# Patient Record
Sex: Female | Born: 1975 | Race: Black or African American | Hispanic: No | Marital: Single | Smoking: Never smoker
Health system: Southern US, Community
[De-identification: ages and names within clinical notes are randomized; demographics above are authoritative.]

## PROBLEM LIST (undated history)

## (undated) DIAGNOSIS — Z789 Other specified health status: Secondary | ICD-10-CM

## (undated) DIAGNOSIS — J302 Other seasonal allergic rhinitis: Secondary | ICD-10-CM

## (undated) HISTORY — PX: NO PAST SURGERIES: SHX2092

## (undated) HISTORY — PX: TOOTH EXTRACTION: SUR596

---

## 2002-06-18 ENCOUNTER — Encounter: Payer: Self-pay | Admitting: Obstetrics and Gynecology

## 2002-06-18 ENCOUNTER — Ambulatory Visit (HOSPITAL_COMMUNITY): Admission: RE | Admit: 2002-06-18 | Discharge: 2002-06-18 | Payer: Self-pay | Admitting: Obstetrics and Gynecology

## 2002-07-16 ENCOUNTER — Other Ambulatory Visit: Admission: RE | Admit: 2002-07-16 | Discharge: 2002-07-16 | Payer: Self-pay | Admitting: Obstetrics and Gynecology

## 2002-07-28 ENCOUNTER — Encounter: Admission: RE | Admit: 2002-07-28 | Discharge: 2002-10-26 | Payer: Self-pay | Admitting: Obstetrics and Gynecology

## 2002-11-20 ENCOUNTER — Inpatient Hospital Stay (HOSPITAL_COMMUNITY): Admission: AD | Admit: 2002-11-20 | Discharge: 2002-11-22 | Payer: Self-pay

## 2004-07-22 ENCOUNTER — Other Ambulatory Visit: Admission: RE | Admit: 2004-07-22 | Discharge: 2004-07-22 | Payer: Self-pay | Admitting: Obstetrics and Gynecology

## 2005-10-06 ENCOUNTER — Other Ambulatory Visit: Admission: RE | Admit: 2005-10-06 | Discharge: 2005-10-06 | Payer: Self-pay | Admitting: Obstetrics & Gynecology

## 2007-08-27 ENCOUNTER — Other Ambulatory Visit: Admission: RE | Admit: 2007-08-27 | Discharge: 2007-08-27 | Payer: Self-pay | Admitting: Obstetrics and Gynecology

## 2008-01-07 ENCOUNTER — Other Ambulatory Visit: Admission: RE | Admit: 2008-01-07 | Discharge: 2008-01-07 | Payer: Self-pay | Admitting: Obstetrics and Gynecology

## 2008-09-07 ENCOUNTER — Other Ambulatory Visit: Admission: RE | Admit: 2008-09-07 | Discharge: 2008-09-07 | Payer: Self-pay | Admitting: Obstetrics and Gynecology

## 2010-10-10 ENCOUNTER — Other Ambulatory Visit (HOSPITAL_COMMUNITY)
Admission: RE | Admit: 2010-10-10 | Discharge: 2010-10-10 | Disposition: A | Discharge status: Home / Self Care | Payer: Managed Care, Other (non HMO) | Source: Ambulatory Visit | Attending: Family Medicine | Admitting: Family Medicine

## 2010-10-10 DIAGNOSIS — Z0000 Encounter for general adult medical examination without abnormal findings: Secondary | ICD-10-CM | POA: Insufficient documentation

## 2010-10-10 DIAGNOSIS — R87810 Cervical high risk human papillomavirus (HPV) DNA test positive: Secondary | ICD-10-CM | POA: Insufficient documentation

## 2011-01-06 NOTE — H&P (Signed)
NAMEDEHLIA, KILNER                      ACCOUNT NO.:  [id number]   MEDICAL RECORD NO.:  [id number]                   PATIENT TYPE:  INP   LOCATION:  9168                                 FACILITY:  [facility]   PHYSICIAN:  Janine Limbo, M.D.            DATE OF BIRTH:  1975/12/30   DATE OF ADMISSION:  11/20/2002  DATE OF DISCHARGE:                                HISTORY & PHYSICAL   Ms. Rosello is a 35 year old, single black female primigravida at 39-6/7  weeks who presents with leaking fluid since 10:30 p.m. with uterine  contractions picking up since.  She denies headache, nausea, vomiting,  visual disturbances.  Her pregnancy has been followed by the [facility] certified nurse midwife service and has been remarkable for:  1. History of abnormal Pap.  2. Rubella nonimmune.  3. Group B strep positive.    LABORATORY DATA:  Her OB labs were collected on 04/23/02: hemoglobin was 12.4,  hematocrit 35.1, platelets 357,000.  Blood type B positive, antibody  negative.  Sickle cell trait negative.  RPR nonreactive.  Rubella negative.  Hepatitis B surface antigen negative.  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  Maternal serum alpha fetoprotein on 05/21/02 was within normal  range.  One hour Glucola in 12/03 was 104 with hemoglobin at that time 10.7.  Culture of the vaginal tract for group B strep on 10/27/02 was positive, GC  and chlamydia from that time were negative.   HISTORY OF PRESENT PREGNANCY:  She presented for care at [facility] on 04/23/02 at  two weeks gestation.  Pelvic ultrasonography at 17.[redacted] weeks gestation showed  growth consistent with last menstrual period dating.  The rest of her  prenatal care was unremarkable.  She is due for a colposcopy postpartum.   OB HISTORY:  She is a primigravida.   PAST MEDICAL HISTORY:  She has no medication allergies.  She has used Ortho-  Tri-Cyclen in  the past, stopped approximately one year ago.  She had an  abnormal Pap smear in 5/03, ASCUS,and in 7/03 CIN1 with a colposcopy.  She  was treated for Trichomonas one year ago, and for chlamydia she was treated  in '02.   SURGICAL HISTORY:  None.   FAMILY MEDICAL HISTORY:  Paternal uncle with myocardial infarction, mother  with hypertension.  Maternal uncle with history of aneurysm.  Mother with  history of anemia and diabetes.  Paternal grandmother with diabetes.  Paternal grandmother receiving dialysis.  Mother with rheumatoid arthritis.  Maternal great-aunts with breast cancer.   GENETIC HISTORY:  Unremarkable.   SOCIAL HISTORY:  Father of the baby's name is Trey Paula, he is involved and  supportive.  They are of the [place] faith. They both have some college  education.  The patient works full time at [employer] of [place].  Father of the  baby works full time at [employer].  They deny any alcohol, tobacco or  illicit drug use with the pregnancy.   PHYSICAL EXAMINATION:  VITAL SIGNS: Stable, she is afebrile.  HEENT: Grossly within normal limits.  CHEST: Clear to auscultation.  HEART: Regular rate and rhythm.  ABDOMEN: Gravid in contour, fundal height extends approximately 40 cm above  the pubic symphysis.  Fetal heart rate is reactive and reassuring.  Uterine  contractions every four minutes.  STERILE SPECULUM EXAM: Copious clear fluid is present, positive Nitrazine,  positive for ferning.  Cervix is three cm, 80%, vertex, -2.  EXTREMITIES: Within normal limits.   ASSESSMENT:  1. Intrauterine pregnancy at term.  2. Spontaneous rupture of membranes for clear fluid, early labor.   PLAN:  1. Admit for birth, consult Dr. Stefano Gaul.  2. Routine CMN orders.  3. Penicillin G for group B strep prophylaxis.  4. Patient plans epidural for labor.     Cam Hai, C.N.M.                     Janine Limbo, M.D.    KS/MEDQ  D:  11/20/2002  T:  11/20/2002  Job:  [id number]

## 2011-09-12 ENCOUNTER — Ambulatory Visit (INDEPENDENT_AMBULATORY_CARE_PROVIDER_SITE_OTHER): Payer: Managed Care, Other (non HMO)

## 2011-09-12 DIAGNOSIS — J019 Acute sinusitis, unspecified: Secondary | ICD-10-CM

## 2011-09-12 DIAGNOSIS — R05 Cough: Secondary | ICD-10-CM

## 2011-10-06 ENCOUNTER — Other Ambulatory Visit: Payer: Self-pay | Admitting: Obstetrics and Gynecology

## 2011-10-06 ENCOUNTER — Other Ambulatory Visit (HOSPITAL_COMMUNITY)
Admission: RE | Admit: 2011-10-06 | Discharge: 2011-10-06 | Disposition: A | Discharge status: Home / Self Care | Payer: Managed Care, Other (non HMO) | Source: Ambulatory Visit | Attending: Obstetrics and Gynecology | Admitting: Obstetrics and Gynecology

## 2011-10-06 DIAGNOSIS — Z01419 Encounter for gynecological examination (general) (routine) without abnormal findings: Secondary | ICD-10-CM | POA: Insufficient documentation

## 2012-03-15 ENCOUNTER — Ambulatory Visit (INDEPENDENT_AMBULATORY_CARE_PROVIDER_SITE_OTHER): Payer: Managed Care, Other (non HMO) | Admitting: Emergency Medicine

## 2012-03-15 ENCOUNTER — Ambulatory Visit: Payer: Managed Care, Other (non HMO)

## 2012-03-15 VITALS — BP 116/76 | HR 76 | Temp 98.5°F | Resp 16 | Ht 69.0 in | Wt 210.4 lb

## 2012-03-15 DIAGNOSIS — M5430 Sciatica, unspecified side: Secondary | ICD-10-CM

## 2012-03-15 MED ORDER — TRAMADOL HCL 50 MG PO TABS: 50.0000 mg | ORAL_TABLET | Freq: Four times a day (QID) | ORAL | Status: AC | PRN

## 2012-03-15 MED ORDER — NAPROXEN SODIUM 550 MG PO TABS: 550.0000 mg | ORAL_TABLET | Freq: Two times a day (BID) | ORAL | Status: DC

## 2012-03-15 MED ORDER — CYCLOBENZAPRINE HCL 10 MG PO TABS: 10.0000 mg | ORAL_TABLET | Freq: Three times a day (TID) | ORAL | Status: AC | PRN

## 2012-03-15 NOTE — Progress Notes (Signed)
   Date:  03/15/2012   Name:  Kimberly Clayton   DOB:  Dec 30, 1975   MRN:  [id number]  PCP:  No primary provider on file.    Chief Complaint: Back Pain and Leg Pain   History of Present Illness:  Kimberly Clayton is a 36 y.o. very pleasant female patient who presents with the following:  History months ago of pain in right leg originating in thigh and radiating down her leg to her anterior calf.  Says the pain is sharp in nature and associated with numbness in right foot at times.  Worse when she sits for prolonged periods at work while working at [employer].  No history of injury.  Had negative plain films at [facility] office previously but never had MR despite her FMD  Thinking that she may have a neuritis.  Now has recurrence of pain that has been present for several weeks and not responding to motrin and interfering with sleep.  There is no problem list on file for this patient.   No past medical history on file.  No past surgical history on file.  History  Substance Use Topics  . Smoking status: Never Smoker   . Smokeless tobacco: Not on file  . Alcohol Use: Not on file    No family history on file.  No Known Allergies  Medication list has been reviewed and updated.  No current outpatient prescriptions on file prior to visit.    Review of Systems:  As per HPI, otherwise negative.    Physical Examination: Filed Vitals:   03/15/12 1026  BP: 116/76  Pulse: 76  Temp: 98.5 F (36.9 C)  Resp: 16   Filed Vitals:   03/15/12 1026  Height: 5\' 9"  (1.753 m)  Weight: 210 lb 6.4 oz (95.437 kg)   Body mass index is 31.07 kg/(m^2). Ideal Body Weight: Weight in (lb) to have BMI = 25: 168.9    GEN: WDWN, NAD, Non-toxic, Alert & Oriented x 3 HEENT: Atraumatic, Normocephalic.  Ears and Nose: No external deformity. EXTR: No clubbing/cyanosis/edema NEURO: Normal gait. Decreased DTR right leg.  Strength normal.  Sensation normal. PSYCH: Normally interactive. Conversant.  Not depressed or anxious appearing.  Calm demeanor.  Back:not tender  Assessment and Plan: Sciatic neuritis Ultram Flexeril Anaprox Heat Follow up in two weeks if not improved  Carmelina Dane, MD

## 2012-07-11 ENCOUNTER — Ambulatory Visit: Payer: Managed Care, Other (non HMO)

## 2012-07-13 ENCOUNTER — Ambulatory Visit: Payer: Managed Care, Other (non HMO)

## 2012-07-13 ENCOUNTER — Ambulatory Visit (INDEPENDENT_AMBULATORY_CARE_PROVIDER_SITE_OTHER): Payer: Managed Care, Other (non HMO) | Admitting: Family Medicine

## 2012-07-13 VITALS — BP 109/81 | HR 88 | Temp 98.1°F | Resp 18 | Ht 69.0 in | Wt 222.0 lb

## 2012-07-13 DIAGNOSIS — M79673 Pain in unspecified foot: Secondary | ICD-10-CM

## 2012-07-13 DIAGNOSIS — M79609 Pain in unspecified limb: Secondary | ICD-10-CM

## 2012-07-13 MED ORDER — IBUPROFEN 800 MG PO TABS: 800.0000 mg | ORAL_TABLET | Freq: Three times a day (TID) | ORAL | Status: DC | PRN

## 2012-07-13 NOTE — Progress Notes (Signed)
[facility]:  Office Visit  Chief Complaint:  Chief Complaint  Patient presents with  . Foot Pain    heel pain, right foot    HPI: Kimberly Clayton is a 36 y.o. female who complains of   Right heel pain x 1 month. Elevate, ice, ibuprofen otc with minimal relief. Gets up in the AM and gets worse as day progesses and being on feet. No h/o PF, injuries. Denies numbness/tingling. No new foot wear. She walks with socks or barefoot at home. Does not have any tendon pain. Does not have any calf pain or toe pain.    History reviewed. No pertinent past medical history. History reviewed. No pertinent past surgical history. History   Social History  . Marital Status: Single    Spouse Name: N/A    Number of Children: N/A  . Years of Education: N/A   Social History Main Topics  . Smoking status: Never Smoker   . Smokeless tobacco: None  . Alcohol Use: No  . Drug Use: No  . Sexually Active: None   Other Topics Concern  . None   Social History Narrative  . None   Family History  Problem Relation Age of Onset  . Diabetes Mother   . Hypertension Mother   . Arthritis Mother    No Known Allergies Prior to Admission medications   Medication Sig Start Date End Date Taking? Authorizing Provider  ibuprofen (ADVIL,MOTRIN) 200 MG tablet Take 400 mg by mouth every 6 (six) hours as needed.    Historical Provider, MD  naproxen sodium (ANAPROX DS) 550 MG tablet Take 1 tablet (550 mg total) by mouth 2 (two) times daily with a meal. 03/15/12 03/15/13  Phillips Odor, MD     ROS: The patient denies fevers, chills, night sweats, unintentional weight loss, chest pain, palpitations, wheezing, dyspnea on exertion, nausea, vomiting, abdominal pain, dysuria, hematuria, melena, numbness, weakness, or tingling.  All other systems have been reviewed and were otherwise negative with the exception of those mentioned in the HPI and as above.    PHYSICAL EXAM: Filed Vitals:   07/13/12 1740  BP: 109/81  Pulse: 88  Temp: 98.1 F (36.7 C)  Resp: 18   Filed Vitals:   07/13/12 1740  Height: 5\' 9"  (1.753 m)  Weight: 222 lb (100.699 kg)   Body mass index is 32.78 kg/(m^2).  General: Alert, no acute distress HEENT:  Normocephalic, atraumatic, oropharynx patent.  Cardiovascular:  Regular rate and rhythm, no rubs murmurs or gallops.  No Carotid bruits, radial pulse intact. No pedal edema.  Respiratory: Clear to auscultation bilaterally.  No wheezes, rales, or rhonchi.  No cyanosis, no use of accessory musculature GI: No organomegaly, abdomen is soft and non-tender, positive bowel sounds.  No masses. Skin: No rashes. Neurologic: Facial musculature symmetric. Psychiatric: Patient is appropriate throughout our interaction. Lymphatic: No cervical lymphadenopathy Musculoskeletal: Gait intact. Tender at right heel, ROM and sensation intact, 5/5 strength, neg anterior drawer test, no pain with plantar or dorsiflexion, no swellingm no redness, no warmth   LABS: No results found for this or any previous visit.   EKG/XRAY:   Primary read interpreted by Dr. Conley Rolls at [facility]. Heel spur. No fx/dislocation   ASSESSMENT/PLAN: Encounter Diagnosis  Name Primary?  . Heel pain Yes   Heel spur on xray, but her sxs are very c/w Plantar fasciitis. She wakes up with heel pain and worse as day progresses.  Advise to Wear gel pads and  Never  to go barefoot. Do ROM and ice with bottle water after a day of being on feet Rx Ibuprofen F/u prn in 1 month    Kimberly Weingarten PHUONG, DO 07/13/2012 7:40 PM

## 2012-08-07 ENCOUNTER — Other Ambulatory Visit: Payer: Self-pay | Admitting: Family Medicine

## 2012-09-06 ENCOUNTER — Other Ambulatory Visit: Payer: Self-pay | Admitting: Physician Assistant

## 2012-12-13 ENCOUNTER — Other Ambulatory Visit (HOSPITAL_COMMUNITY)
Admission: RE | Admit: 2012-12-13 | Discharge: 2012-12-13 | Disposition: A | Discharge status: Home / Self Care | Payer: Managed Care, Other (non HMO) | Source: Ambulatory Visit | Attending: Obstetrics and Gynecology | Admitting: Obstetrics and Gynecology

## 2012-12-13 ENCOUNTER — Other Ambulatory Visit: Payer: Self-pay | Admitting: Obstetrics and Gynecology

## 2012-12-13 DIAGNOSIS — Z01419 Encounter for gynecological examination (general) (routine) without abnormal findings: Secondary | ICD-10-CM | POA: Insufficient documentation

## 2012-12-13 DIAGNOSIS — Z1151 Encounter for screening for human papillomavirus (HPV): Secondary | ICD-10-CM | POA: Insufficient documentation

## 2012-12-26 ENCOUNTER — Encounter (HOSPITAL_COMMUNITY): Payer: Self-pay | Admitting: Pharmacy Technician

## 2013-01-03 ENCOUNTER — Encounter (HOSPITAL_COMMUNITY): Payer: Self-pay

## 2013-01-03 ENCOUNTER — Encounter (HOSPITAL_COMMUNITY)
Admission: RE | Admit: 2013-01-03 | Discharge: 2013-01-03 | Disposition: A | Discharge status: Home / Self Care | Payer: Managed Care, Other (non HMO) | Source: Ambulatory Visit | Attending: Obstetrics and Gynecology | Admitting: Obstetrics and Gynecology

## 2013-01-03 HISTORY — DX: Other specified health status: Z78.9

## 2013-01-03 HISTORY — DX: Other seasonal allergic rhinitis: J30.2

## 2013-01-03 LAB — CBC
HCT: 33 % — ABNORMAL LOW (ref 36.0–46.0)
Hemoglobin: 10.9 g/dL — ABNORMAL LOW (ref 12.0–15.0)
MCV: 97.9 fL (ref 78.0–100.0)
RDW: 11 % — ABNORMAL LOW (ref 11.5–15.5)
WBC: 8.8 10*3/uL (ref 4.0–10.5)

## 2013-01-03 LAB — SURGICAL PCR SCREEN: Staphylococcus aureus: NEGATIVE

## 2013-01-03 NOTE — Patient Instructions (Addendum)
   Your procedure is scheduled on:  Wednesday, May 21  Enter through the [employer] [facility] at: 1030 am Pick up the phone at the desk and dial [phone] and inform us of your arrival.  Please call this number if you have any problems the morning of surgery: [phone]  Remember: Do not eat or drink after midnight: Tuesday Take these medicines the morning of surgery with a SIP OF WATER: zyrtec with small sip of water.  Do not wear jewelry, make-up, or FINGER nail polish No metal in your hair or on your body. Do not wear lotions, powders, perfumes. You may wear deodorant.  Please use your CHG wash as directed prior to surgery.  Do not shave anywhere for at least 12 hours prior to first CHG shower.  Do not bring valuables to the hospital. Contacts, dentures or bridgework may not be worn into surgery.  Patients discharged on the day of surgery will not be allowed to drive home.  Home with mother Demetri Kerman.

## 2013-01-07 ENCOUNTER — Other Ambulatory Visit: Payer: Self-pay | Admitting: Obstetrics and Gynecology

## 2013-01-08 ENCOUNTER — Encounter (HOSPITAL_COMMUNITY): Payer: Self-pay | Admitting: Anesthesiology

## 2013-01-08 ENCOUNTER — Encounter (HOSPITAL_COMMUNITY)
Admission: RE | Disposition: A | Discharge status: Home / Self Care | Payer: Self-pay | Source: Ambulatory Visit | Attending: Obstetrics and Gynecology

## 2013-01-08 ENCOUNTER — Ambulatory Visit (HOSPITAL_COMMUNITY): Payer: Managed Care, Other (non HMO) | Admitting: Anesthesiology

## 2013-01-08 ENCOUNTER — Encounter (HOSPITAL_COMMUNITY): Payer: Self-pay | Admitting: Certified Registered"

## 2013-01-08 ENCOUNTER — Ambulatory Visit (HOSPITAL_COMMUNITY)
Admission: RE | Admit: 2013-01-08 | Discharge: 2013-01-08 | Disposition: A | Discharge status: Home / Self Care | Payer: Managed Care, Other (non HMO) | Source: Ambulatory Visit | Attending: Obstetrics and Gynecology | Admitting: Obstetrics and Gynecology

## 2013-01-08 DIAGNOSIS — Z302 Encounter for sterilization: Secondary | ICD-10-CM | POA: Insufficient documentation

## 2013-01-08 HISTORY — PX: LAPAROSCOPIC TUBAL LIGATION: SHX1937

## 2013-01-08 LAB — PREGNANCY, URINE: Preg Test, Ur: NEGATIVE

## 2013-01-08 SURGERY — LIGATION, FALLOPIAN TUBE, LAPAROSCOPIC
Anesthesia: Epidural | Laterality: Bilateral | Wound class: Clean Contaminated

## 2013-01-08 MED ORDER — MIDAZOLAM HCL 2 MG/2ML IJ SOLN
INTRAMUSCULAR | Status: AC
  Filled 2013-01-08: qty 2

## 2013-01-08 MED ORDER — ONDANSETRON HCL 4 MG/2ML IJ SOLN
INTRAMUSCULAR | Status: DC | PRN
  Administered 2013-01-08: 4 mg via INTRAVENOUS

## 2013-01-08 MED ORDER — FENTANYL CITRATE 0.05 MG/ML IJ SOLN
INTRAMUSCULAR | Status: AC
  Filled 2013-01-08: qty 5

## 2013-01-08 MED ORDER — CEFAZOLIN SODIUM-DEXTROSE 2-3 GM-% IV SOLR
2.0000 g | INTRAVENOUS | Status: AC
  Administered 2013-01-08: 2 g via INTRAVENOUS

## 2013-01-08 MED ORDER — NEOSTIGMINE METHYLSULFATE 1 MG/ML IJ SOLN
INTRAMUSCULAR | Status: DC | PRN
  Administered 2013-01-08: 3 mg via INTRAVENOUS

## 2013-01-08 MED ORDER — PHENYLEPHRINE 40 MCG/ML (10ML) SYRINGE FOR IV PUSH (FOR BLOOD PRESSURE SUPPORT)
PREFILLED_SYRINGE | INTRAVENOUS | Status: AC
  Filled 2013-01-08: qty 5

## 2013-01-08 MED ORDER — KETOROLAC TROMETHAMINE 30 MG/ML IJ SOLN: 30.0000 mg | Freq: Once | INTRAMUSCULAR | Status: AC

## 2013-01-08 MED ORDER — IBUPROFEN 800 MG PO TABS: 800.0000 mg | ORAL_TABLET | Freq: Three times a day (TID) | ORAL | Status: DC | PRN

## 2013-01-08 MED ORDER — GLYCOPYRROLATE 0.2 MG/ML IJ SOLN
INTRAMUSCULAR | Status: DC | PRN
  Administered 2013-01-08: .5 mg via INTRAVENOUS

## 2013-01-08 MED ORDER — ROCURONIUM BROMIDE 100 MG/10ML IV SOLN
INTRAVENOUS | Status: DC | PRN
  Administered 2013-01-08: 35 mg via INTRAVENOUS
  Administered 2013-01-08: 15 mg via INTRAVENOUS

## 2013-01-08 MED ORDER — BUPIVACAINE HCL (PF) 0.25 % IJ SOLN
INTRAMUSCULAR | Status: AC
  Filled 2013-01-08: qty 30

## 2013-01-08 MED ORDER — MIDAZOLAM HCL 5 MG/5ML IJ SOLN
INTRAMUSCULAR | Status: DC | PRN
  Administered 2013-01-08: 2 mg via INTRAVENOUS

## 2013-01-08 MED ORDER — DEXAMETHASONE SODIUM PHOSPHATE 4 MG/ML IJ SOLN
INTRAMUSCULAR | Status: DC | PRN
  Administered 2013-01-08: 10 mg via INTRAVENOUS

## 2013-01-08 MED ORDER — LACTATED RINGERS IV SOLN
INTRAVENOUS | Status: DC
  Administered 2013-01-08 (×3): via INTRAVENOUS

## 2013-01-08 MED ORDER — BUPIVACAINE HCL (PF) 0.25 % IJ SOLN
INTRAMUSCULAR | Status: DC | PRN
  Administered 2013-01-08: 30 mL

## 2013-01-08 MED ORDER — PROPOFOL 10 MG/ML IV BOLUS
INTRAVENOUS | Status: DC | PRN
  Administered 2013-01-08: 180 mg via INTRAVENOUS

## 2013-01-08 MED ORDER — FENTANYL CITRATE 0.05 MG/ML IJ SOLN
INTRAMUSCULAR | Status: DC | PRN
  Administered 2013-01-08 (×5): 50 ug via INTRAVENOUS

## 2013-01-08 MED ORDER — PHENYLEPHRINE HCL 10 MG/ML IJ SOLN
INTRAMUSCULAR | Status: DC | PRN
  Administered 2013-01-08 (×2): 80 mg via INTRAVENOUS

## 2013-01-08 MED ORDER — PROPOFOL 10 MG/ML IV EMUL
INTRAVENOUS | Status: AC
  Filled 2013-01-08: qty 20

## 2013-01-08 MED ORDER — KETOROLAC TROMETHAMINE 30 MG/ML IJ SOLN
INTRAMUSCULAR | Status: AC
  Administered 2013-01-08: 30 mg via INTRAVENOUS
  Filled 2013-01-08: qty 1

## 2013-01-08 MED ORDER — FENTANYL CITRATE 0.05 MG/ML IJ SOLN: 25.0000 ug | INTRAMUSCULAR | Status: DC | PRN

## 2013-01-08 MED ORDER — HYDROCODONE-ACETAMINOPHEN 5-325 MG PO TABS: ORAL_TABLET | ORAL | Status: DC

## 2013-01-08 MED ORDER — LIDOCAINE HCL (CARDIAC) 20 MG/ML IV SOLN
INTRAVENOUS | Status: AC
  Filled 2013-01-08: qty 5

## 2013-01-08 MED ORDER — DEXAMETHASONE SODIUM PHOSPHATE 10 MG/ML IJ SOLN
INTRAMUSCULAR | Status: AC
  Filled 2013-01-08: qty 1

## 2013-01-08 MED ORDER — ONDANSETRON HCL 4 MG/2ML IJ SOLN
INTRAMUSCULAR | Status: AC
  Filled 2013-01-08: qty 2

## 2013-01-08 MED ORDER — LIDOCAINE HCL (CARDIAC) 20 MG/ML IV SOLN
INTRAVENOUS | Status: DC | PRN
  Administered 2013-01-08: 100 mg via INTRAVENOUS

## 2013-01-08 MED ORDER — CEFAZOLIN SODIUM-DEXTROSE 2-3 GM-% IV SOLR
INTRAVENOUS | Status: AC
  Filled 2013-01-08: qty 50

## 2013-01-08 SURGICAL SUPPLY — 20 items
APPLICATOR COTTON TIP 6IN STRL (MISCELLANEOUS) ×2 IMPLANT
CATH ROBINSON RED A/P 16FR (CATHETERS) ×2 IMPLANT
CLOTH BEACON ORANGE TIMEOUT ST (SAFETY) ×2 IMPLANT
DERMABOND ADVANCED (GAUZE/BANDAGES/DRESSINGS) ×1
DERMABOND ADVANCED .7 DNX12 (GAUZE/BANDAGES/DRESSINGS) ×1 IMPLANT
DILATOR CANAL MILEX (MISCELLANEOUS) IMPLANT
FORCEPS CUTTING 45CM 5MM (CUTTING FORCEPS) ×2 IMPLANT
GLOVE BIOGEL M 6.5 STRL (GLOVE) ×4 IMPLANT
GLOVE BIOGEL PI IND STRL 6.5 (GLOVE) ×1 IMPLANT
GLOVE BIOGEL PI INDICATOR 6.5 (GLOVE) ×1
GOWN PREVENTION PLUS LG XLONG (DISPOSABLE) ×2 IMPLANT
GOWN PREVENTION PLUS XLARGE (GOWN DISPOSABLE) ×2 IMPLANT
PACK LAPAROSCOPY BASIN (CUSTOM PROCEDURE TRAY) ×2 IMPLANT
SOLUTION ELECTROLUBE (MISCELLANEOUS) ×2 IMPLANT
SUT VICRYL 0 UR6 27IN ABS (SUTURE) ×2 IMPLANT
SUT VICRYL 4-0 PS2 18IN ABS (SUTURE) ×2 IMPLANT
TOWEL OR 17X24 6PK STRL BLUE (TOWEL DISPOSABLE) ×4 IMPLANT
TROCAR XCEL NON-BLD 11X100MML (ENDOMECHANICALS) ×2 IMPLANT
WARMER LAPAROSCOPE (MISCELLANEOUS) ×2 IMPLANT
WATER STERILE IRR 1000ML POUR (IV SOLUTION) ×2 IMPLANT

## 2013-01-08 NOTE — Op Note (Signed)
01/08/2013  1:13 PM  PATIENT:  Kimberly Clayton  37 y.o. female  PRE-OPERATIVE DIAGNOSIS:  Desires Sterilization  POST-OPERATIVE DIAGNOSIS:  Desires Sterilization  PROCEDURE:  Procedure(s): LAPAROSCOPIC TUBAL LIGATION (Bilateral)  SURGEON:  Surgeon(s) and Role:    * Bellah Alia J. Richardson Dopp, MD - Primary  PHYSICIAN ASSISTANT: None   ASSISTANTS: none   ANESTHESIA:   general  EBL:  Total I/O In: 1000 [I.V.:1000] Out: 50 [Urine:50]  BLOOD ADMINISTERED:none  DRAINS: none   LOCAL MEDICATIONS USED:  MARCAINE     SPECIMEN:  No Specimen  DISPOSITION OF SPECIMEN:  N/A  COUNTS:  YES  TOURNIQUET:  * No tourniquets in log *  DICTATION: .Dragon Dictation  PLAN OF CARE: Discharge to home after PACU  PATIENT DISPOSITION:  PACU - hemodynamically stable.   Delay start of Pharmacological VTE agent (>24hrs) due to surgical blood loss or risk of bleeding: not applicable  Procedure: The patient was taken to the operating room where she was placed under general anesthesia. She is placed in the dorsolithotomy position. She was prepped and draped in the usual sterile fashion. Speculum was placed into the vaginal vault. In the anterior lip of the cervix grasped with a single-tooth tenaculum. A uterine manipulator was placed without difficulty. The single-tooth tenaculum was  Removed. The speculum was removed.  Attention was turned to the umbilicus which was injected with 10 cc of quarter percent Marcaine. A 11 mm incision was made with the scalpel. A 11 mm trocar was placed under direct visualization. Entry into the peritoneum was visualized. The pneumoperitoneum was achieved with CO2 gas.  Her  fallopian tubes and ovaries appeared normal.  Operative scope was inserted and the right fallopian tube was identified and followed out to the fimbriated end. The a 2-3 cm portion of the right fallopian tube was cauterized with the Gyrus.  This was repeated on the left fallopian tube The Gyrus was removed. A  laparoscopic needle was inserted and 10 cc of quarter percent Marcaine was placed along the ampullary portion of the tubes  bilaterally. Pneumoperitoneum was released. The umbilical trocar was removed under direct visualization. The fascia was closed with 0 Vicryl. The skin was closed with 4-0 Vicryl. The dermabond placed a over the skin incision.  Patient was awakened from anesthesia taken to the recovery room awake and in stable condition.

## 2013-01-08 NOTE — Anesthesia Preprocedure Evaluation (Signed)
Anesthesia Evaluation  Patient identified by MRN, date of birth, ID band Patient awake    Reviewed: Allergy & Precautions, H&P , Patient's Chart, lab work & pertinent test results, reviewed documented beta blocker date and time   Airway Mallampati: II TM Distance: >3 FB Neck ROM: full    Dental no notable dental hx.    Pulmonary  breath sounds clear to auscultation  Pulmonary exam normal       Cardiovascular Rhythm:regular Rate:Normal     Neuro/Psych    GI/Hepatic   Endo/Other    Renal/GU      Musculoskeletal   Abdominal   Peds  Hematology   Anesthesia Other Findings   Reproductive/Obstetrics                           Anesthesia Physical Anesthesia Plan  ASA: II  Anesthesia Plan: Epidural   Post-op Pain Management:    Induction: Intravenous  Airway Management Planned: Oral ETT  Additional Equipment:   Intra-op Plan:   Post-operative Plan:   Informed Consent: I have reviewed the patients History and Physical, chart, labs and discussed the procedure including the risks, benefits and alternatives for the proposed anesthesia with the patient or authorized representative who has indicated his/her understanding and acceptance.   Dental Advisory Given and Dental advisory given  Plan Discussed with: CRNA and Surgeon  Anesthesia Plan Comments: (  Discussed  general anesthesia, including possible nausea, instrumentation of airway, sore throat,pulmonary aspiration, etc. I asked if the were any outstanding questions, or  concerns before we proceeded. )        Anesthesia Quick Evaluation

## 2013-01-08 NOTE — H&P (Signed)
Kimberly Clayton is an 37 y.o. female G1P1 presents for laparoscopic BTL. She desires permanent sterilization   Pertinent Gynecological History: Menses: flow is moderate Bleeding: NA Contraception: desires BTL DES exposure: denies Blood transfusions: none Sexually transmitted diseases: no past history Previous GYN Procedures: LEEP  Last mammogram: NA Date: NA Last pap: normal Date: 12/13/2012 OB History: G1, P1   Menstrual History:      Past Medical History  Diagnosis Date  . SVD (spontaneous vaginal delivery)     x 1  . Seasonal allergies   . Medical history non-contributory     Past Surgical History  Procedure Laterality Date  . Tooth extraction    . No past surgeries      Family History  Problem Relation Age of Onset  . Diabetes Mother   . Hypertension Mother   . Arthritis Mother     Social History:  reports that she has never smoked. She has never used smokeless tobacco. She reports that  drinks alcohol. She reports that she does not use illicit drugs.  Allergies: No Known Allergies  Prescriptions prior to admission  Medication Sig Dispense Refill  . Cyanocobalamin (B-12 PO) Take 2 tablets by mouth daily.      . fluticasone (FLONASE) 50 MCG/ACT nasal spray Place 2 sprays into the nose daily.      . meloxicam (MOBIC) 15 MG tablet Take 15 mg by mouth daily.      . cetirizine (ZYRTEC) 10 MG tablet Take 10 mg by mouth daily.      Marland Kitchen ibuprofen (ADVIL,MOTRIN) 200 MG tablet Take 400 mg by mouth every 6 (six) hours as needed for pain.         Review of Systems  All other systems reviewed and are negative.    Blood pressure 132/76, pulse 106, temperature 98.6 F (37 C), temperature source Oral, resp. rate 18, SpO2 100.00%. Physical Exam  Constitutional: She is oriented to person, place, and time.  HENT:  Head: Normocephalic.  Cardiovascular: Normal rate and regular rhythm.   Respiratory: Effort normal and breath sounds normal.  GI: Soft. There is no  tenderness. There is no rebound and no guarding.  Genitourinary: Vagina normal and uterus normal.  Musculoskeletal: Normal range of motion.  Neurological: She is alert and oriented to person, place, and time.  Skin: Skin is warm and dry.  Psychiatric: She has a normal mood and affect.    No results found for this or any previous visit (from the past 24 hour(s)).  No results found.  Assessment/Plan: 37 y/o desires permanent sterilization. D/W patient r/b/a of laparoscopic tubal ligation including but not limited to infection/ bleeding / damage to bowel bladder tubes with need for further surgery. R/O failure 1 % with 50% r/o ectopic if failure occurs. Pt voiced understanding and desires to proceed   Aron Needles J. 01/08/2013, 10:49 AM

## 2013-01-08 NOTE — Anesthesia Postprocedure Evaluation (Signed)
  Anesthesia Post-op Note  Patient: Kimberly Clayton Jun  Procedure(s) Performed: Procedure(s): LAPAROSCOPIC TUBAL LIGATION (Bilateral)  Patient Location: PACU  Anesthesia Type:General  Level of Consciousness: awake, alert  and oriented  Airway and Oxygen Therapy: Patient Spontanous Breathing  Post-op Pain: none  Post-op Assessment: Post-op Vital signs reviewed, Patient's Cardiovascular Status Stable, Respiratory Function Stable, Patent Airway, No signs of Nausea or vomiting and Pain level controlled  Post-op Vital Signs: Reviewed and stable  Complications: No apparent anesthesia complications

## 2013-01-08 NOTE — Transfer of Care (Signed)
Immediate Anesthesia Transfer of Care Note  Patient: Kimberly Clayton  Procedure(s) Performed: Procedure(s): LAPAROSCOPIC TUBAL LIGATION (Bilateral)  Patient Location: PACU  Anesthesia Type:General  Level of Consciousness: awake, alert  and oriented  Airway & Oxygen Therapy: Patient Spontanous Breathing and Patient connected to nasal cannula oxygen  Post-op Assessment: Report given to PACU RN, Post -op Vital signs reviewed and stable and Patient moving all extremities  Post vital signs: Reviewed and stable  Complications: No apparent anesthesia complications

## 2013-01-08 NOTE — Preoperative (Signed)
Beta Blockers   Reason not to administer Beta Blockers:Not Applicable 

## 2013-01-09 ENCOUNTER — Encounter (HOSPITAL_COMMUNITY): Payer: Self-pay | Admitting: Obstetrics and Gynecology

## 2014-01-09 ENCOUNTER — Other Ambulatory Visit (HOSPITAL_COMMUNITY)
Admission: RE | Admit: 2014-01-09 | Discharge: 2014-01-09 | Disposition: A | Discharge status: Home / Self Care | Payer: Managed Care, Other (non HMO) | Source: Ambulatory Visit | Attending: Obstetrics and Gynecology | Admitting: Obstetrics and Gynecology

## 2014-01-09 ENCOUNTER — Other Ambulatory Visit: Payer: Self-pay | Admitting: Obstetrics and Gynecology

## 2014-01-09 DIAGNOSIS — Z01419 Encounter for gynecological examination (general) (routine) without abnormal findings: Secondary | ICD-10-CM | POA: Insufficient documentation

## 2014-07-28 ENCOUNTER — Ambulatory Visit (INDEPENDENT_AMBULATORY_CARE_PROVIDER_SITE_OTHER): Payer: Managed Care, Other (non HMO) | Admitting: Family Medicine

## 2014-07-28 VITALS — BP 118/80 | HR 90 | Temp 97.8°F | Resp 18 | Ht 69.0 in | Wt 227.4 lb

## 2014-07-28 DIAGNOSIS — F489 Nonpsychotic mental disorder, unspecified: Secondary | ICD-10-CM

## 2014-07-28 DIAGNOSIS — J019 Acute sinusitis, unspecified: Secondary | ICD-10-CM

## 2014-07-28 DIAGNOSIS — M542 Cervicalgia: Secondary | ICD-10-CM

## 2014-07-28 DIAGNOSIS — Z658 Other specified problems related to psychosocial circumstances: Secondary | ICD-10-CM

## 2014-07-28 MED ORDER — METHOCARBAMOL 750 MG PO TABS: 750.0000 mg | ORAL_TABLET | Freq: Four times a day (QID) | ORAL | Status: DC

## 2014-07-28 MED ORDER — AMOXICILLIN 875 MG PO TABS: 875.0000 mg | ORAL_TABLET | Freq: Two times a day (BID) | ORAL | Status: DC

## 2014-07-28 NOTE — Patient Instructions (Signed)
Drink plenty of fluids and get enough rest  Take amoxicillin one twice daily for infection  Continue using the fluticasone nose spray  Take Claritin-D or Allegra-D if needed for congestion  Take the muscle relaxant, methocarbamol, 1 at bedtime  Stretch shoulders in a fashion we discussed  Return if not improving

## 2014-07-28 NOTE — Progress Notes (Signed)
SUBJECTIVE: Patient has been having and upper respiratory infection for more han the past week. Sure it's being congested. She has been having green drainage now. She has some cough.t Thursday little bit sore. Her ears feel stuffy. Her ears feel stuffy. She's not been running a fever. She does not smoke.  She had a job change and has had a lot of tension. She just on the phone after she doesn't like. Her shoulder and neck sclerae tense. She would like some muscle relaxant.  Objective: No acute distress but head is congested. TMs are normal. Nose congested. Throat clear. Neck supple without nodes. Chest clear. Heart regular without murmurs. Neck is tight. Good range of motion of her head.  Assessment: Tension in cervical pain secondary to that URI and sinusitis  Plan: It is time for her to have a round of antibiotics since it is been over a week. Will go ahead and treat symptomatically.  Give her some Robaxin to take at bedtime. Harder stretch exercises for her neck. Return if needed.

## 2014-09-07 ENCOUNTER — Other Ambulatory Visit: Payer: Self-pay | Admitting: Obstetrics and Gynecology

## 2014-09-07 ENCOUNTER — Other Ambulatory Visit (HOSPITAL_COMMUNITY)
Admission: RE | Admit: 2014-09-07 | Discharge: 2014-09-07 | Disposition: A | Discharge status: Home / Self Care | Payer: BLUE CROSS/BLUE SHIELD | Source: Ambulatory Visit | Attending: Obstetrics and Gynecology | Admitting: Obstetrics and Gynecology

## 2014-09-07 DIAGNOSIS — Z01419 Encounter for gynecological examination (general) (routine) without abnormal findings: Secondary | ICD-10-CM | POA: Insufficient documentation

## 2014-09-07 DIAGNOSIS — Z113 Encounter for screening for infections with a predominantly sexual mode of transmission: Secondary | ICD-10-CM | POA: Insufficient documentation

## 2014-09-08 LAB — CYTOLOGY - PAP

## 2015-11-19 ENCOUNTER — Other Ambulatory Visit: Payer: Self-pay | Admitting: Obstetrics and Gynecology

## 2015-11-19 ENCOUNTER — Other Ambulatory Visit (HOSPITAL_COMMUNITY)
Admission: RE | Admit: 2015-11-19 | Discharge: 2015-11-19 | Disposition: A | Discharge status: Home / Self Care | Payer: 59 | Source: Ambulatory Visit | Attending: Obstetrics and Gynecology | Admitting: Obstetrics and Gynecology

## 2015-11-19 DIAGNOSIS — Z01411 Encounter for gynecological examination (general) (routine) with abnormal findings: Secondary | ICD-10-CM | POA: Diagnosis present

## 2015-11-19 DIAGNOSIS — Z113 Encounter for screening for infections with a predominantly sexual mode of transmission: Secondary | ICD-10-CM | POA: Diagnosis present

## 2015-11-19 DIAGNOSIS — Z1151 Encounter for screening for human papillomavirus (HPV): Secondary | ICD-10-CM | POA: Insufficient documentation

## 2015-11-23 LAB — CYTOLOGY - PAP

## 2016-01-27 ENCOUNTER — Other Ambulatory Visit: Payer: Self-pay | Admitting: Family Medicine

## 2016-01-27 DIAGNOSIS — M5441 Lumbago with sciatica, right side: Secondary | ICD-10-CM

## 2016-02-03 ENCOUNTER — Ambulatory Visit
Admission: RE | Admit: 2016-02-03 | Discharge: 2016-02-03 | Disposition: A | Discharge status: Home / Self Care | Payer: 59 | Source: Ambulatory Visit | Attending: Family Medicine | Admitting: Family Medicine

## 2016-02-03 DIAGNOSIS — M5441 Lumbago with sciatica, right side: Secondary | ICD-10-CM

## 2017-02-10 ENCOUNTER — Ambulatory Visit (INDEPENDENT_AMBULATORY_CARE_PROVIDER_SITE_OTHER): Payer: 59

## 2017-02-10 ENCOUNTER — Ambulatory Visit (HOSPITAL_COMMUNITY): Payer: Self-pay

## 2017-02-10 ENCOUNTER — Ambulatory Visit (INDEPENDENT_AMBULATORY_CARE_PROVIDER_SITE_OTHER): Payer: 59 | Admitting: Physician Assistant

## 2017-02-10 ENCOUNTER — Encounter: Payer: Self-pay | Admitting: Physician Assistant

## 2017-02-10 VITALS — BP 107/74 | HR 71 | Temp 98.8°F | Resp 16 | Ht 69.5 in | Wt 237.8 lb

## 2017-02-10 DIAGNOSIS — G479 Sleep disorder, unspecified: Secondary | ICD-10-CM | POA: Insufficient documentation

## 2017-02-10 DIAGNOSIS — M545 Low back pain, unspecified: Secondary | ICD-10-CM

## 2017-02-10 DIAGNOSIS — M542 Cervicalgia: Secondary | ICD-10-CM | POA: Diagnosis not present

## 2017-02-10 DIAGNOSIS — S39012A Strain of muscle, fascia and tendon of lower back, initial encounter: Secondary | ICD-10-CM

## 2017-02-10 DIAGNOSIS — S161XXA Strain of muscle, fascia and tendon at neck level, initial encounter: Secondary | ICD-10-CM

## 2017-02-10 DIAGNOSIS — J302 Other seasonal allergic rhinitis: Secondary | ICD-10-CM | POA: Insufficient documentation

## 2017-02-10 MED ORDER — CYCLOBENZAPRINE HCL 10 MG PO TABS: ORAL_TABLET | ORAL | 0 refills | Status: DC

## 2017-02-10 MED ORDER — IBUPROFEN 800 MG PO TABS: 800.0000 mg | ORAL_TABLET | Freq: Three times a day (TID) | ORAL | 0 refills | Status: DC | PRN

## 2017-02-10 MED ORDER — TRAMADOL HCL 50 MG PO TABS: 50.0000 mg | ORAL_TABLET | Freq: Three times a day (TID) | ORAL | 0 refills | Status: DC | PRN
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
## 2017-02-10 NOTE — Patient Instructions (Signed)
     IF you received an x-ray today, you will receive an invoice from [facility]. Please contact [facility] at [phone] with questions or concerns regarding your invoice.   IF you received labwork today, you will receive an invoice from [facility]. Please contact [facility] at [phone] with questions or concerns regarding your invoice.   Our billing staff will not be able to assist you with questions regarding bills from these companies.  You will be contacted with the lab results as soon as they are available. The fastest way to get your results is to activate your My Chart account. Instructions are located on the last page of this paperwork. If you have not heard from us regarding the results in 2 weeks, please contact this office.     

## 2017-02-12 ENCOUNTER — Telehealth: Payer: Self-pay | Admitting: Physician Assistant
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
## 2017-02-12 NOTE — Telephone Encounter (Signed)
I see the results are negative. Before I call her with results,is there additional information needed?

## 2017-02-13 NOTE — Telephone Encounter (Signed)
Called, spoke with patient, informed of results.

## 2017-11-19 ENCOUNTER — Other Ambulatory Visit (HOSPITAL_COMMUNITY)
Admission: RE | Admit: 2017-11-19 | Discharge: 2017-11-19 | Disposition: A | Discharge status: Home / Self Care | Payer: 59 | Source: Ambulatory Visit | Attending: Obstetrics and Gynecology | Admitting: Obstetrics and Gynecology

## 2017-11-19 ENCOUNTER — Other Ambulatory Visit: Payer: Self-pay | Admitting: Obstetrics and Gynecology

## 2017-11-19 DIAGNOSIS — Z124 Encounter for screening for malignant neoplasm of cervix: Secondary | ICD-10-CM | POA: Insufficient documentation

## 2017-11-21 LAB — CYTOLOGY - PAP
Diagnosis: NEGATIVE
HPV: NOT DETECTED

## 2018-06-12 ENCOUNTER — Encounter: Payer: Self-pay | Admitting: Physician Assistant

## 2018-06-12 ENCOUNTER — Ambulatory Visit: Payer: 59 | Admitting: Physician Assistant

## 2018-06-12 VITALS — BP 124/74 | HR 98 | Temp 98.2°F | Resp 17 | Ht 69.5 in | Wt 229.0 lb

## 2018-06-12 DIAGNOSIS — M62838 Other muscle spasm: Secondary | ICD-10-CM

## 2018-06-12 DIAGNOSIS — M542 Cervicalgia: Secondary | ICD-10-CM | POA: Diagnosis not present

## 2018-06-12 MED ORDER — CYCLOBENZAPRINE HCL 10 MG PO TABS: ORAL_TABLET | ORAL | 0 refills | Status: AC

## 2018-06-12 MED ORDER — IBUPROFEN 800 MG PO TABS: 800.0000 mg | ORAL_TABLET | Freq: Three times a day (TID) | ORAL | 0 refills | Status: AC | PRN

## 2018-06-12 NOTE — Progress Notes (Signed)
Kimberly Clayton  MRN: [id number] DOB: [birth year]  Subjective:  Kimberly Clayton is a 42 y.o. female seen in office today for a chief complaint neck pain. The patient has had recurrent self limited episodes of neck pain and spasms in the past. Symptoms have been present for 2 weeks and are unchanged.  Onset was related to / precipitated by no known injury. Happens after sitting at work and looking down at computer for long periods of time,  also stress related. Typically gets relief with muscle relaxants and ibuprofen but is out of these medications. Has also tried ice and stretching, which help. Denies numbness, tingling, weakness, fever, chills, N/V, headache.   Review of Systems Per HPI Patient Active Problem List   Diagnosis Date Noted  . Seasonal allergies 02/10/2017  . Sleep disturbance 02/10/2017    Current Outpatient Medications on File Prior to Visit  Medication Sig Dispense Refill  . buPROPion (WELLBUTRIN SR) 100 MG 12 hr tablet Take 100 mg by mouth 2 (two) times daily.    . Cyanocobalamin (B-12 PO) Take 2 tablets by mouth daily.    . DULoxetine (CYMBALTA) 30 MG capsule Take 30 mg by mouth 2 (two) times daily.    . traZODone (DESYREL) 100 MG tablet TAKE 1/2 TO 1 TABLET BY MOUTH AT BEDTIME AS NEEDED FOR INSOMNIA  0   No current facility-administered medications on file prior to visit.     No Known Allergies   Objective:  BP 124/74   Pulse 98   Temp 98.2 F (36.8 C) (Oral)   Resp 17   Ht 5' 9.5" (1.765 m)   Wt 229 lb (103.9 kg)   LMP 05/22/2018 (Approximate)   SpO2 98%   BMI 33.33 kg/m   Physical Exam  Constitutional: She is oriented to person, place, and time. She appears well-developed and well-nourished.  HENT:  Head: Normocephalic and atraumatic.  Eyes: Conjunctivae are normal.  Neck: Normal range of motion and full passive range of motion without pain. Neck supple. Muscular tenderness (with palpation of b/l musculature, tight upon palpation) present.  No spinous process tenderness present. No neck rigidity. No edema, no erythema and normal range of motion present.  Pulmonary/Chest: Effort normal.  Musculoskeletal:       Cervical back: She exhibits spasm. She exhibits normal range of motion, no bony tenderness and no swelling.       Back:  Neurological: She is alert and oriented to person, place, and time.  Reflex Scores:      Tricep reflexes are 2+ on the right side and 2+ on the left side.      Bicep reflexes are 2+ on the right side and 2+ on the left side.      Brachioradialis reflexes are 2+ on the right side and 2+ on the left side. Sensation BUE intact.   Skin: Skin is warm and dry.  Psychiatric: She has a normal mood and affect.  Vitals reviewed.   Assessment and Plan :  1. Neck pain - cyclobenzaprine (FLEXERIL) 10 MG tablet; TAKE 1 TABLET BY MOUTH 3 TIMES A DAY AS NEEDED FOR SPASM  Dispense: 60 tablet; Refill: 0 - ibuprofen (ADVIL,MOTRIN) 800 MG tablet; Take 1 tablet (800 mg total) by mouth every 8 (eight) hours as needed.  Dispense: 60 tablet; Refill: 0 2. Neck muscle spasm Hx and PE findings consistent with neck spasm. No acute findings on neuro exam. No midline tenderness. Recommend conservative tx with rest, heating pad, NSAIDs, muscle relaxants,  and stretching as tolerated. Given education material for neck stretches. Advised to return to clinic if symptoms worsen, do not improve, or as needed.  - cyclobenzaprine (FLEXERIL) 10 MG tablet; TAKE 1 TABLET BY MOUTH 3 TIMES A DAY AS NEEDED FOR SPASM  Dispense: 60 tablet; Refill: 0 - ibuprofen (ADVIL,MOTRIN) 800 MG tablet; Take 1 tablet (800 mg total) by mouth every 8 (eight) hours as needed.  Dispense: 60 tablet; Refill: 0     Benjiman Core PA-C  Primary Care at [facility] 06/12/2018 5:59 PM

## 2018-06-12 NOTE — Patient Instructions (Addendum)
If you have lab work done today you will be contacted with your lab results within the next 2 weeks.  If you have not heard from [place] then please contact us. The fastest way to get your results is to register for My Chart.  Neck Exercises Neck exercises can be important for many reasons:  They can help you to improve and maintain flexibility in your neck. This can be especially important as you age.  They can help to make your neck stronger. This can make movement easier.  They can reduce or prevent neck pain.  They may help your upper back.  Ask your health care provider which neck exercises would be best for you. Exercises Neck Press Repeat this exercise 10 times. Do it first thing in the morning and right before bed or as told by your health care provider. 1. Lie on your back on a firm bed or on the floor with a pillow under your head. 2. Use your neck muscles to push your head down on the pillow and straighten your spine. 3. Hold the position as well as you can. Keep your head facing up and your chin tucked. 4. Slowly count to 5 while holding this position. 5. Relax for a few seconds. Then repeat.  Isometric Strengthening Do a full set of these exercises 2 times a day or as told by your health care provider. 1. Sit in a supportive chair and place your hand on your forehead. 2. Push forward with your head and neck while pushing back with your hand. Hold for 10 seconds. 3. Relax. Then repeat the exercise 3 times. 4. Next, do thesequence again, this time putting your hand against the back of your head. Use your head and neck to push backward against the hand pressure. 5. Finally, do the same exercise on either side of your head, pushing sideways against the pressure of your hand.  Prone Head Lifts Repeat this exercise 5 times. Do this 2 times a day or as told by your health care provider. 1. Lie face-down, resting on your elbows so that your chest and upper back are  raised. 2. Start with your head facing downward, near your chest. Position your chin either on or near your chest. 3. Slowly lift your head upward. Lift until you are looking straight ahead. Then continue lifting your head as far back as you can stretch. 4. Hold your head up for 5 seconds. Then slowly lower it to your starting position.  Supine Head Lifts Repeat this exercise 8-10 times. Do this 2 times a day or as told by your health care provider. 1. Lie on your back, bending your knees to point to the ceiling and keeping your feet flat on the floor. 2. Lift your head slowly off the floor, raising your chin toward your chest. 3. Hold for 5 seconds. 4. Relax and repeat.  Scapular Retraction Repeat this exercise 5 times. Do this 2 times a day or as told by your health care provider. 1. Stand with your arms at your sides. Look straight ahead. 2. Slowly pull both shoulders backward and downward until you feel a stretch between your shoulder blades in your upper back. 3. Hold for 10-30 seconds. 4. Relax and repeat.  Contact a health care provider if:  Your neck pain or discomfort gets much worse when you do an exercise.  Your neck pain or discomfort does not improve within 2 hours after you exercise. If you have any  of these problems, stop exercising right away. Do not do the exercises again unless your health care provider says that you can. Get help right away if:  You develop sudden, severe neck pain. If this happens, stop exercising right away. Do not do the exercises again unless your health care provider says that you can. Exercises Neck Stretch  Repeat this exercise 3-5 times. 1. Do this exercise while standing or while sitting in a chair. 2. Place your feet flat on the floor, shoulder-width apart. 3. Slowly turn your head to the right. Turn it all the way to the right so you can look over your right shoulder. Do not tilt or tip your head. 4. Hold this position for 10-30  seconds. 5. Slowly turn your head to the left, to look over your left shoulder. 6. Hold this position for 10-30 seconds.  Neck Retraction Repeat this exercise 8-10 times. Do this 3-4 times a day or as told by your health care provider. 1. Do this exercise while standing or while sitting in a sturdy chair. 2. Look straight ahead. Do not bend your neck. 3. Use your fingers to push your chin backward. Do not bend your neck for this movement. Continue to face straight ahead. If you are doing the exercise properly, you will feel a slight sensation in your throat and a stretch at the back of your neck. 4. Hold the stretch for 1-2 seconds. Relax and repeat.  This information is not intended to replace advice given to you by your health care provider. Make sure you discuss any questions you have with your health care provider. Document Released: 07/19/2015 Document Revised: 01/13/2016 Document Reviewed: 02/15/2015 Elsevier Interactive Patient Education  2018 [employer].   IF you received an x-ray today, you will receive an invoice from [facility]. Please contact [facility] at [phone] with questions or concerns regarding your invoice.   IF you received labwork today, you will receive an invoice from [place]. Please contact [facility] at [phone] with questions or concerns regarding your invoice.   Our billing staff will not be able to assist you with questions regarding bills from these companies.  You will be contacted with the lab results as soon as they are available. The fastest way to get your results is to activate your My Chart account. Instructions are located on the last page of this paperwork. If you have not heard from [place] regarding the results in 2 weeks, please contact this office.

## 2019-11-06 DIAGNOSIS — R197 Diarrhea, unspecified: Secondary | ICD-10-CM | POA: Diagnosis not present

## 2019-11-06 DIAGNOSIS — R52 Pain, unspecified: Secondary | ICD-10-CM | POA: Diagnosis not present

## 2019-11-07 DIAGNOSIS — R197 Diarrhea, unspecified: Secondary | ICD-10-CM | POA: Diagnosis not present

## 2019-11-07 DIAGNOSIS — Z1152 Encounter for screening for COVID-19: Secondary | ICD-10-CM | POA: Diagnosis not present

## 2019-12-26 DIAGNOSIS — M5136 Other intervertebral disc degeneration, lumbar region: Secondary | ICD-10-CM | POA: Diagnosis not present

## 2019-12-26 DIAGNOSIS — Z0000 Encounter for general adult medical examination without abnormal findings: Secondary | ICD-10-CM | POA: Diagnosis not present

## 2019-12-26 DIAGNOSIS — N852 Hypertrophy of uterus: Secondary | ICD-10-CM | POA: Diagnosis not present

## 2019-12-26 DIAGNOSIS — F419 Anxiety disorder, unspecified: Secondary | ICD-10-CM | POA: Diagnosis not present

## 2019-12-26 DIAGNOSIS — E049 Nontoxic goiter, unspecified: Secondary | ICD-10-CM | POA: Diagnosis not present

## 2019-12-29 ENCOUNTER — Other Ambulatory Visit: Payer: Self-pay | Admitting: Family Medicine

## 2019-12-29 DIAGNOSIS — E049 Nontoxic goiter, unspecified: Secondary | ICD-10-CM

## 2019-12-29 DIAGNOSIS — N852 Hypertrophy of uterus: Secondary | ICD-10-CM

## 2020-01-08 ENCOUNTER — Ambulatory Visit
Admission: RE | Admit: 2020-01-08 | Discharge: 2020-01-08 | Disposition: A | Discharge status: Home / Self Care | Payer: 59 | Source: Ambulatory Visit | Attending: Family Medicine | Admitting: Family Medicine

## 2020-01-08 DIAGNOSIS — Z13220 Encounter for screening for lipoid disorders: Secondary | ICD-10-CM | POA: Diagnosis not present

## 2020-01-08 DIAGNOSIS — N852 Hypertrophy of uterus: Secondary | ICD-10-CM

## 2020-01-08 DIAGNOSIS — E049 Nontoxic goiter, unspecified: Secondary | ICD-10-CM

## 2020-01-08 DIAGNOSIS — E042 Nontoxic multinodular goiter: Secondary | ICD-10-CM | POA: Diagnosis not present

## 2020-01-08 DIAGNOSIS — D259 Leiomyoma of uterus, unspecified: Secondary | ICD-10-CM | POA: Diagnosis not present

## 2020-01-08 DIAGNOSIS — R5383 Other fatigue: Secondary | ICD-10-CM | POA: Diagnosis not present

## 2020-01-08 DIAGNOSIS — Z0000 Encounter for general adult medical examination without abnormal findings: Secondary | ICD-10-CM | POA: Diagnosis not present

## 2020-02-05 DIAGNOSIS — D252 Subserosal leiomyoma of uterus: Secondary | ICD-10-CM | POA: Diagnosis not present

## 2020-02-05 DIAGNOSIS — R102 Pelvic and perineal pain: Secondary | ICD-10-CM | POA: Diagnosis not present

## 2020-02-05 DIAGNOSIS — E669 Obesity, unspecified: Secondary | ICD-10-CM | POA: Diagnosis not present

## 2020-02-05 DIAGNOSIS — N939 Abnormal uterine and vaginal bleeding, unspecified: Secondary | ICD-10-CM | POA: Diagnosis not present

## 2020-11-28 IMAGING — US US THYROID
1 series · 13 of 25 positions shown · non-contrast
Comparison: None.

CLINICAL DATA: Enlarged thyroid

EXAM:
THYROID ULTRASOUND
TECHNIQUE: Ultrasound examination of the thyroid gland and adjacent soft
tissues was performed.

[Series 1: us thyroid · 0.06mm/px · 13 of 54 slices shown]
[im 1/54]
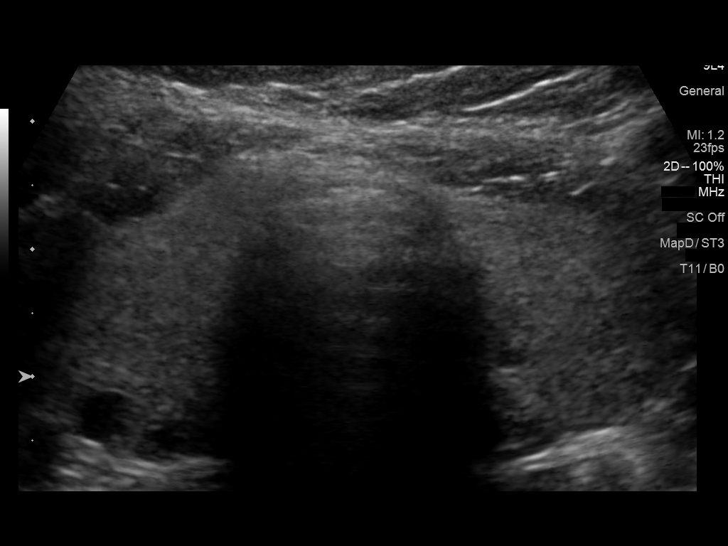
[im 5/54]
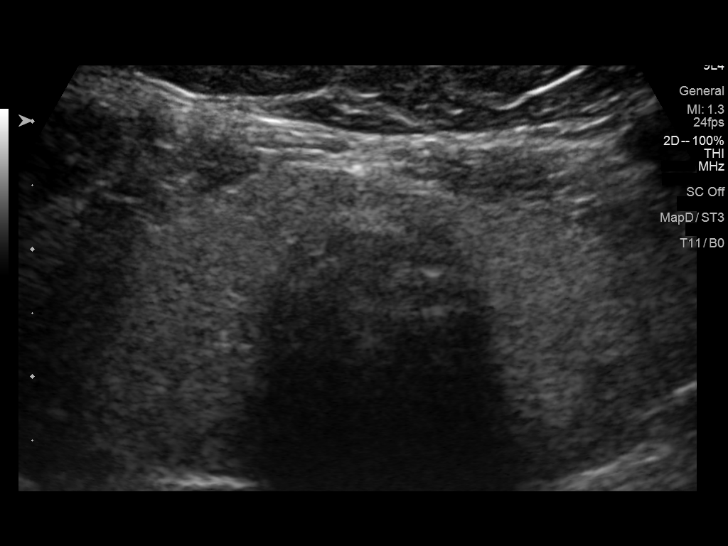
[im 9/54]
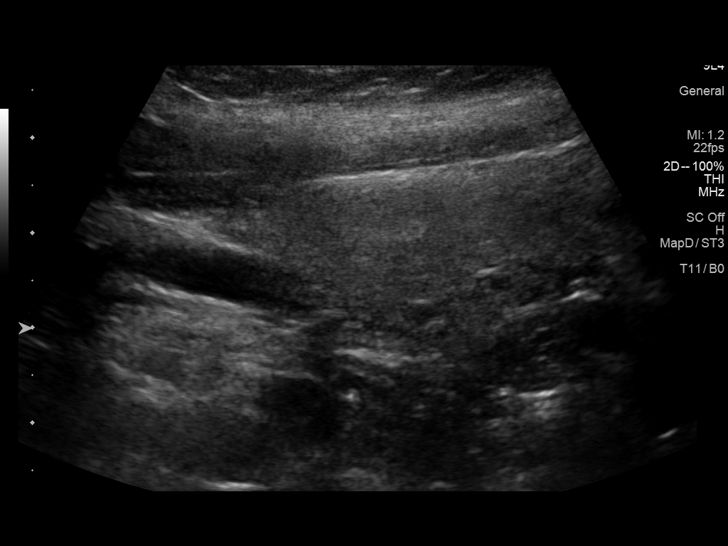
[im 14/54]
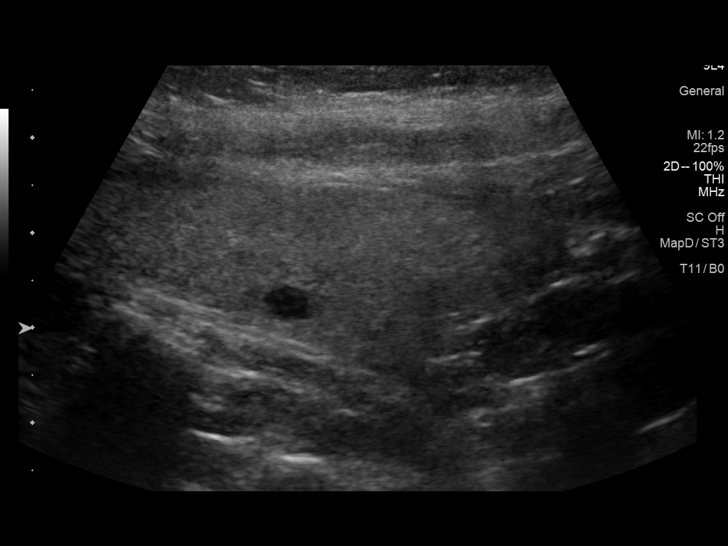
[im 18/54]
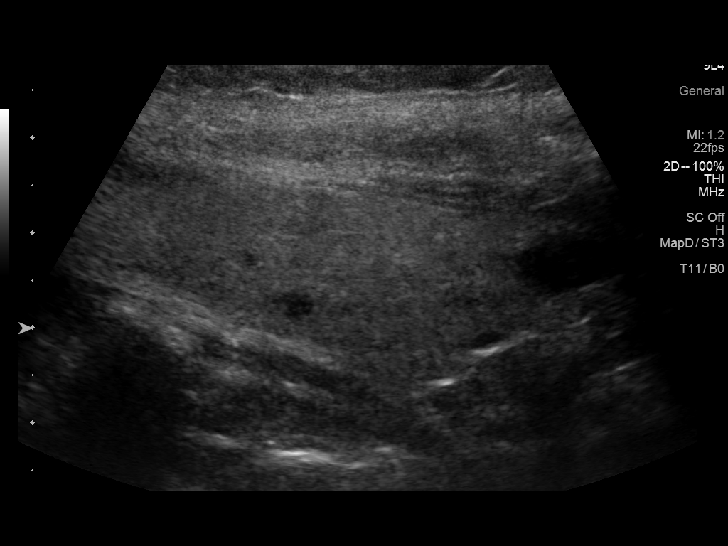
[im 23/54]
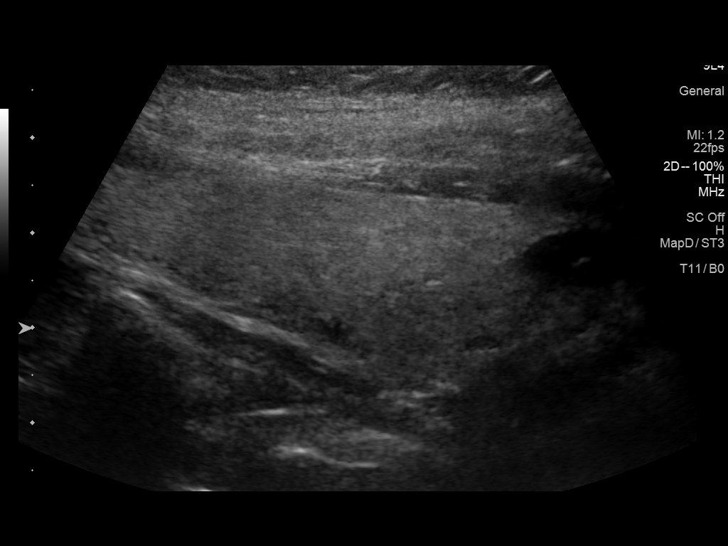
[im 27/54]
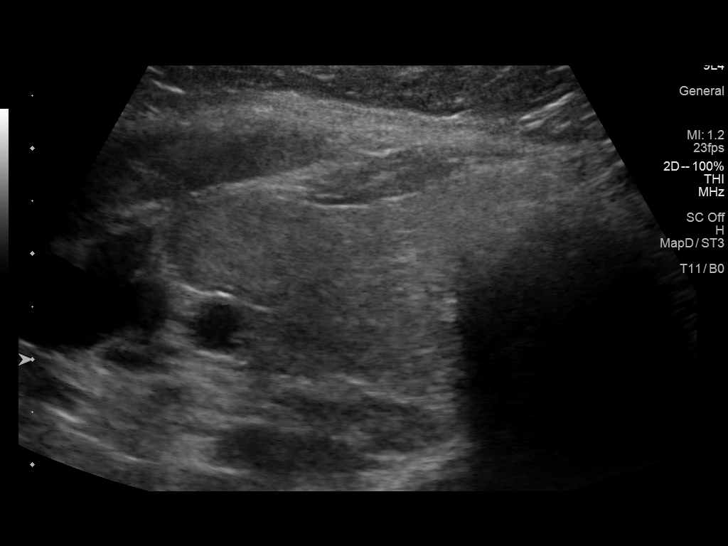
[im 31/54]
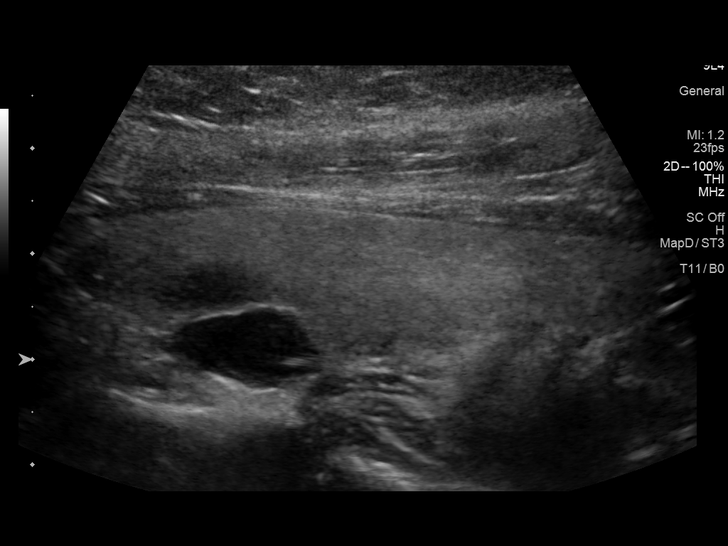
[im 36/54]
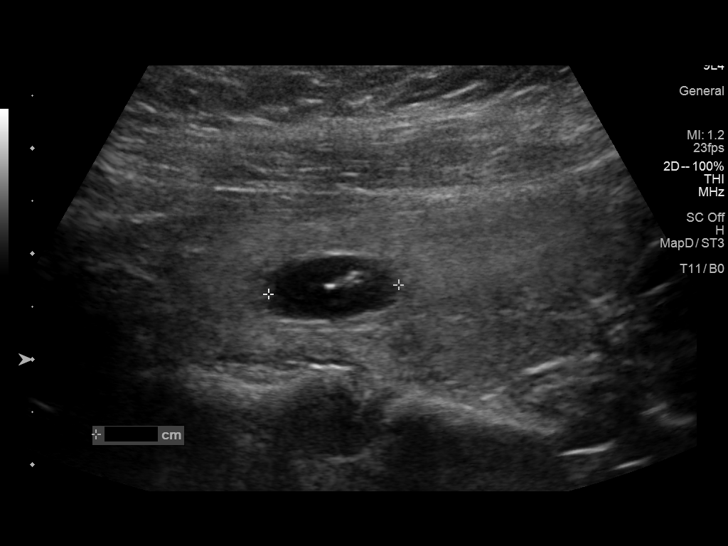
[im 40/54]
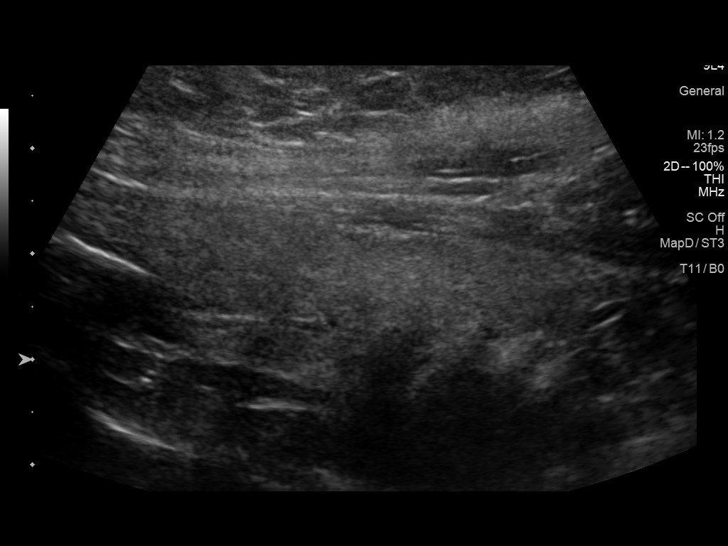
[im 45/54]
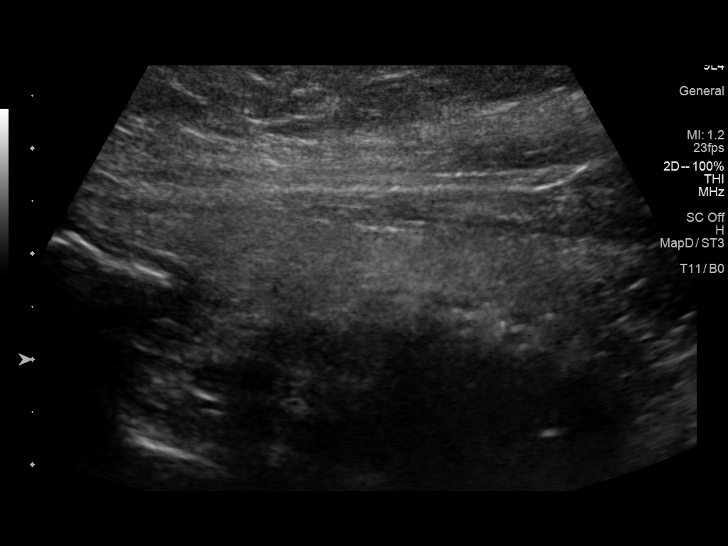
[im 49/54]
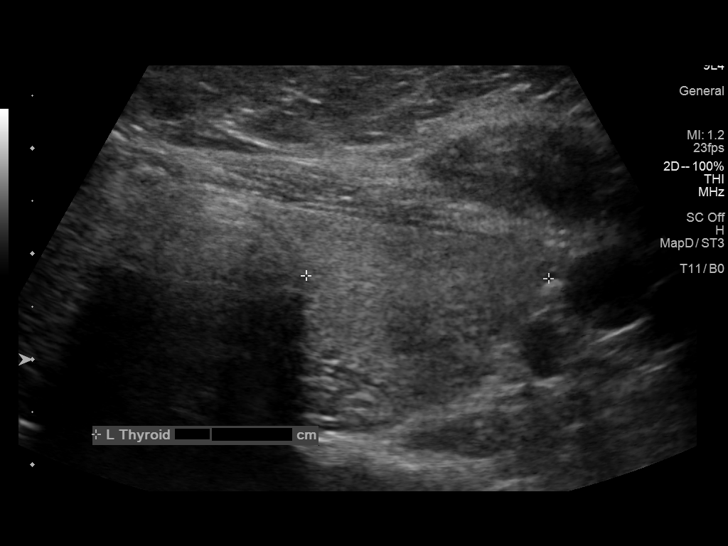
[im 54/54]
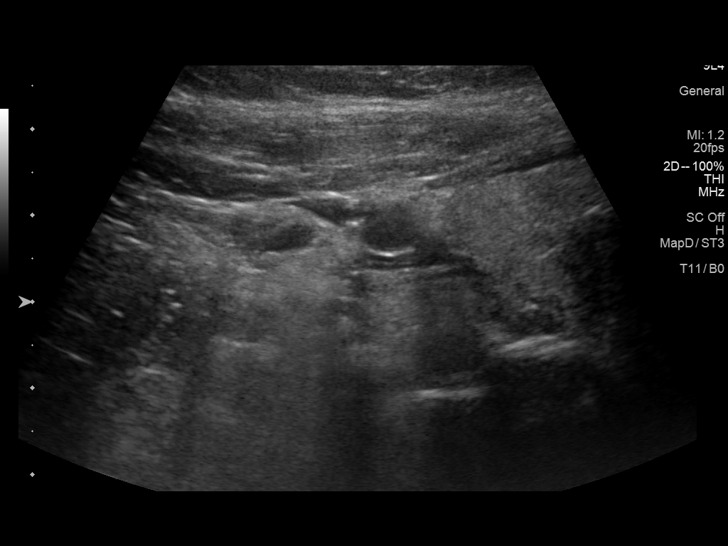

[13 of 25 positions shown; findings below may reference images not displayed]

FINDINGS: Parenchymal Echotexture: Normal

Isthmus: 5 mm

Right lobe: 6.7 x 2.5 x 2.7 cm

Left lobe: 6.0 x 1.8 x 2.3 cm

_________________________________________________________

Estimated total number of nodules >/= 1 cm: 3

Number of spongiform nodules >/=  2 cm not described below (TR1): 0

Number of mixed cystic and solid nodules >/= 1.5 cm not described
below (TR2): 0

_________________________________________________________

Nodule # 4:

Location: Left; Mid

Maximum size: 1.5 cm; Other 2 dimensions: 1.1 x 0.7 cm

Composition: cystic/almost completely cystic (0)

Echogenicity: anechoic (0)

Shape: not taller-than-wide (0)

Margins: smooth (0)

Echogenic foci: none (0)

ACR TI-RADS total points: 0.

ACR TI-RADS risk category: TR1 (0-1 points).

ACR TI-RADS recommendations:

This nodule does NOT meet TI-RADS criteria for biopsy or dedicated
follow-up.

_________________________________________________________

There are additional bilateral similar TR 1 cystic nodules noted all
measuring 1.3 cm or less. These also would not meet criteria for any
biopsy or follow-up.

Otherwise normal thyroid parenchyma and vascularity. No significant
solid thyroid nodule. No regional adenopathy.
IMPRESSION: Benign bilateral cystic TR 1 nodules as above. No other significant
thyroid abnormality.

The above is in keeping with the ACR TI-RADS recommendations - [HOSPITAL] 3115;[DATE].

## 2020-11-28 IMAGING — US US PELVIS COMPLETE WITH TRANSVAGINAL
1 series · 13 of 25 positions shown · non-contrast
Comparison: None

CLINICAL DATA: Enlarged uterus on exam, LMP 12/19/2019

EXAM:
TRANSABDOMINAL AND TRANSVAGINAL ULTRASOUND OF PELVIS
TECHNIQUE: Both transabdominal and transvaginal ultrasound examinations of the
pelvis were performed. Transabdominal technique was performed for
global imaging of the pelvis including uterus, ovaries, adnexal
regions, and pelvic cul-de-sac. It was necessary to proceed with
endovaginal exam following the transabdominal exam to visualize the
endometrium and ovaries.

[Series 1: us pelvis complete with transvaginal · 0.31mm/px · 13 of 67 slices shown]
[im 1/67]
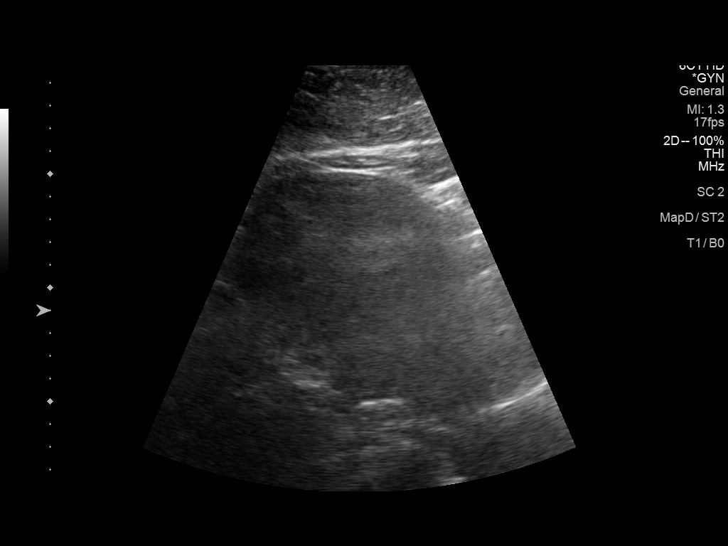
[im 6/67]
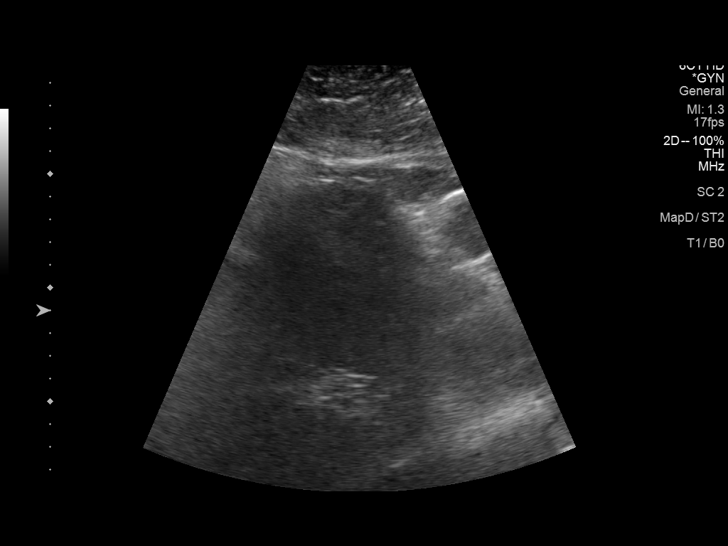
[im 12/67]
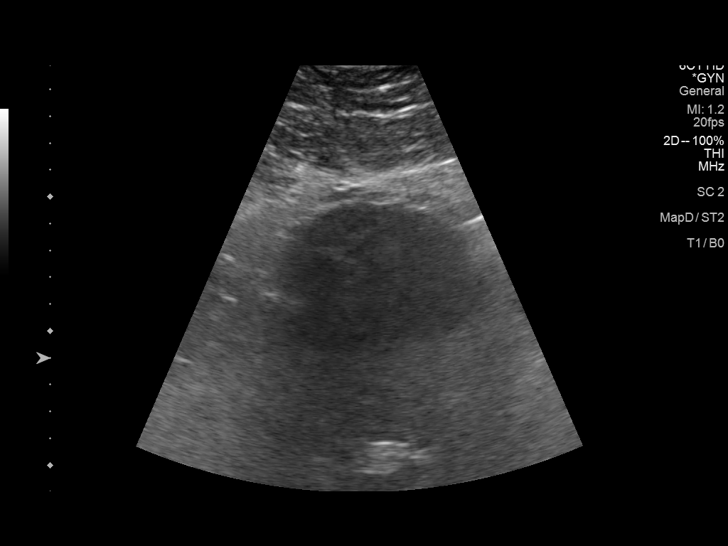
[im 17/67]
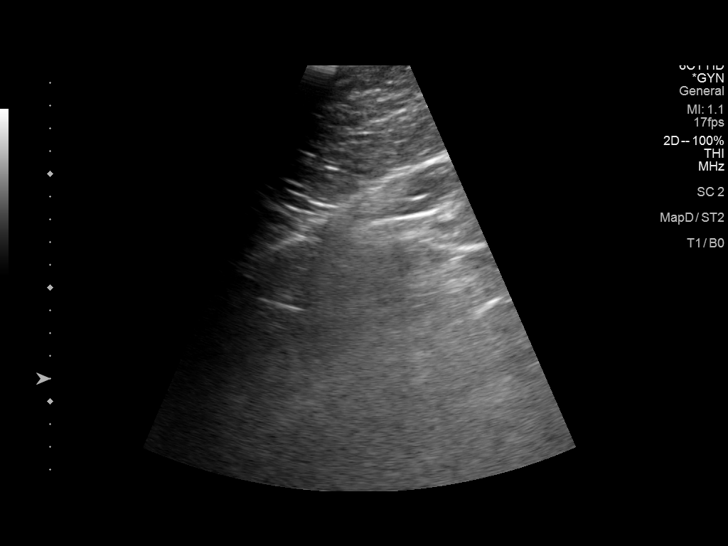
[im 23/67]
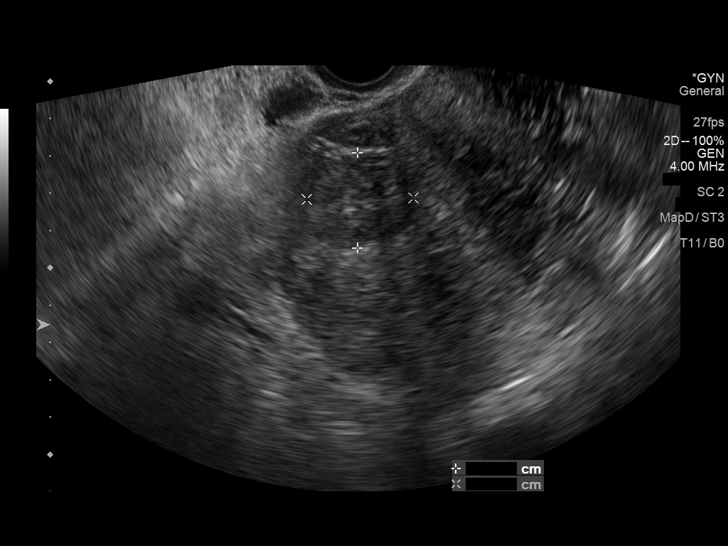
[im 28/67]
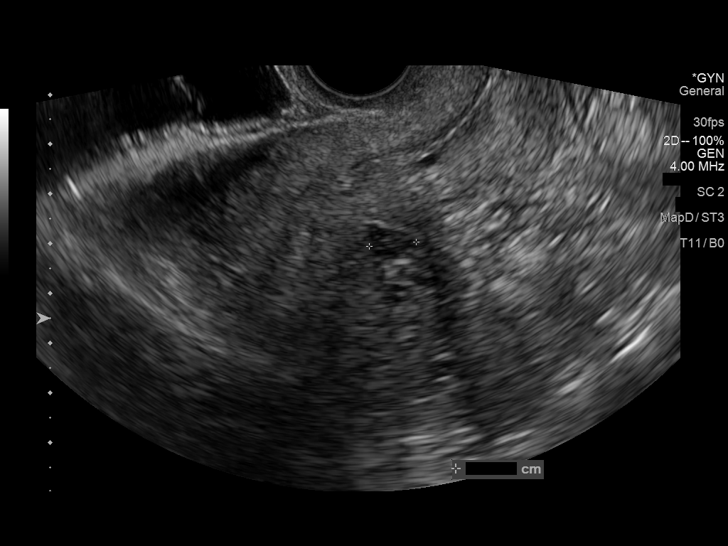
[im 34/67]
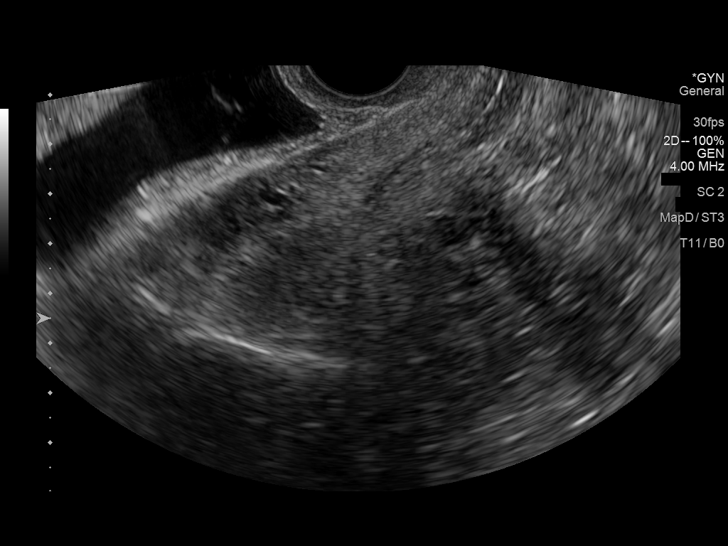
[im 39/67]
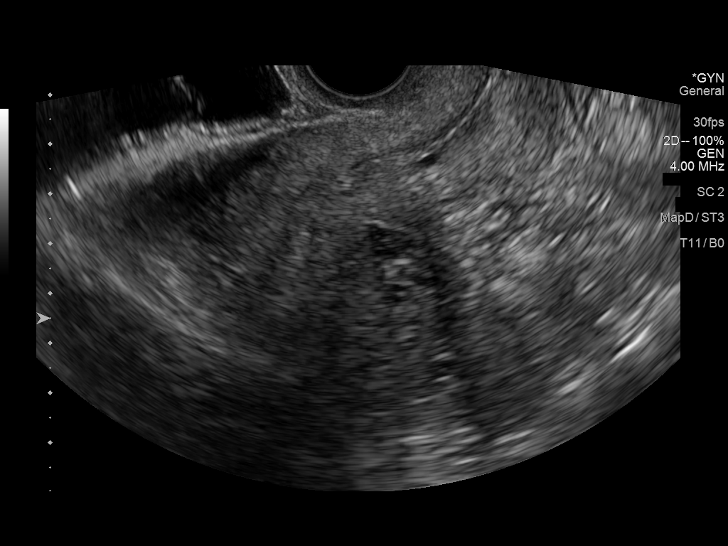
[im 45/67]
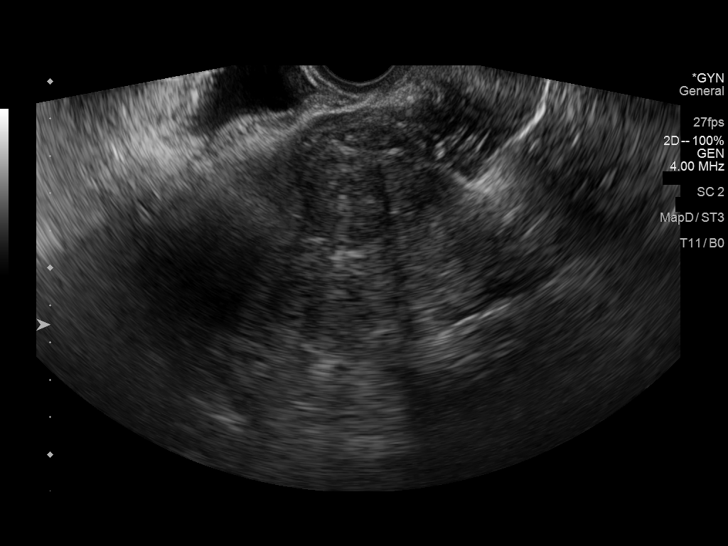
[im 50/67]
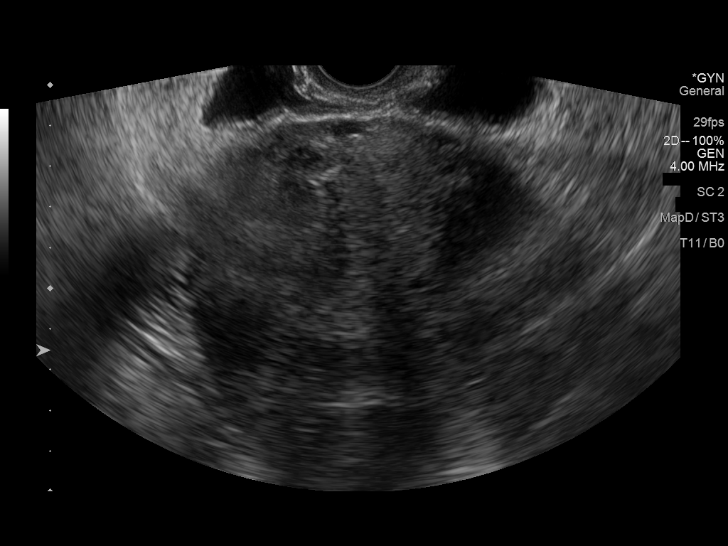
[im 56/67]
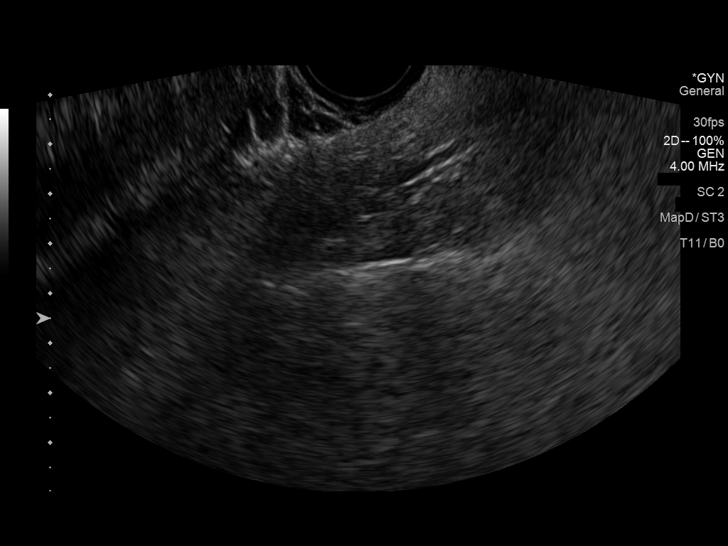
[im 61/67]
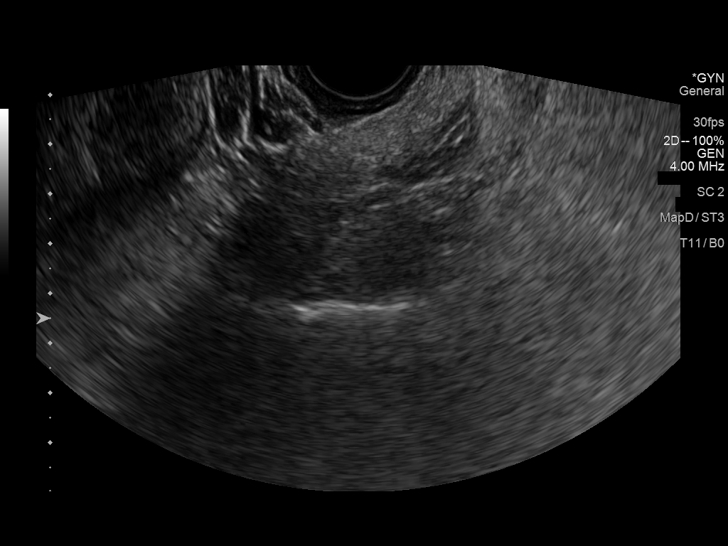
[im 67/67]
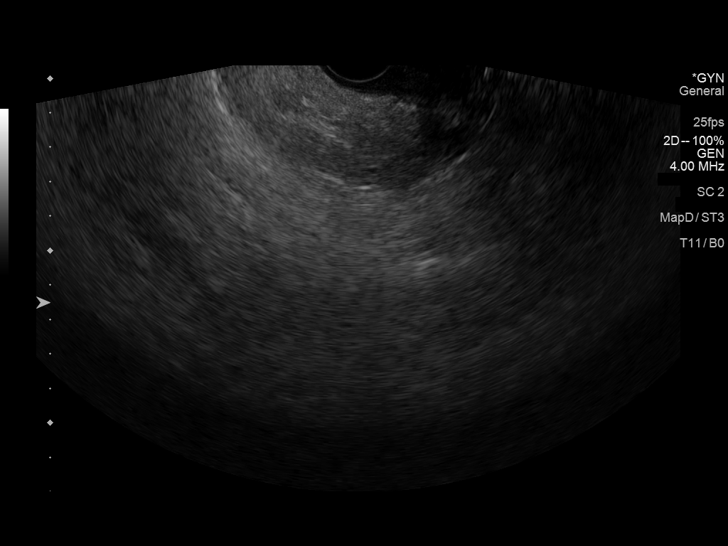

[13 of 25 positions shown; findings below may reference images not displayed]

FINDINGS: Uterus

Measurements: 12.0 x 9.0 x 10.8 cm = volume: 608 mL. Enlarged,
heterogeneous, anteverted. Multiple leiomyomata identified. Largest
lesions measure 3.3 x 2.6 x 3.3 cm subserosal at posterior upper
uterus, 2.4 x 2.6 x 2.9 cm intramural at anterior uterus, and 2.9 x
2.0 x 2.2 cm subserosal at LEFT posterior uterus

Endometrium

Thickness: 10 mm.  No endometrial fluid or focal abnormality

Right ovary

Not visualized, likely obscured by bowel

Left ovary

Measurements: 4.2 x 2.1 x 2.5 cm = volume: 11.1 mL. Normal
morphology without mass

Other findings

No free pelvic fluid or adnexal masses.
IMPRESSION: Enlarged uterus containing multiple leiomyomata.

Nonvisualization of RIGHT ovary.

Remainder of exam unremarkable.

## 2021-12-06 ENCOUNTER — Encounter: Payer: Self-pay | Admitting: Pulmonary Disease

## 2021-12-06 ENCOUNTER — Ambulatory Visit (INDEPENDENT_AMBULATORY_CARE_PROVIDER_SITE_OTHER): Payer: 59 | Admitting: Pulmonary Disease

## 2021-12-06 VITALS — BP 124/70 | HR 96 | Temp 97.9°F | Ht 69.0 in | Wt 257.4 lb

## 2021-12-06 DIAGNOSIS — J4540 Moderate persistent asthma, uncomplicated: Secondary | ICD-10-CM | POA: Diagnosis not present

## 2021-12-06 MED ORDER — ALBUTEROL SULFATE HFA 108 (90 BASE) MCG/ACT IN AERS: 2.0000 | INHALATION_SPRAY | Freq: Four times a day (QID) | RESPIRATORY_TRACT | 6 refills | Status: AC | PRN

## 2021-12-06 MED ORDER — PREDNISONE 20 MG PO TABS: 20.0000 mg | ORAL_TABLET | Freq: Every day | ORAL | 0 refills | Status: AC

## 2021-12-06 MED ORDER — BREZTRI AEROSPHERE 160-9-4.8 MCG/ACT IN AERO: 2.0000 | INHALATION_SPRAY | Freq: Two times a day (BID) | RESPIRATORY_TRACT | 11 refills | Status: DC

## 2021-12-06 NOTE — Progress Notes (Signed)
@Patient  ID: , female    DOB: Jul 23, 1976, 46 y.o.   MRN: [id number]  Chief Complaint  Patient presents with   Consult    Consult for chroninc cough, SOB, wheezing and asthma. Pt states that this has been occurring for about 3 months. Was treated for acid reflux but it got worse.     Referring provider: [id number], PA  HPI:   46 y.o. woman whom we are seeing in consultation for evaluation of chronic cough and asthma.  Note from referring provider reviewed.  Patient contracted viral illness 2 months ago.  Since then has had prolonged cough.  Worse at night.  Has tried various inhalers including Symbicort and Advair.  Mild improvement but still significant symptom burden.  Does improve with prednisone courses albeit short duration.  There is some concern for reflux was placed on a PPI.  Cough did not improve, somewhat worsened she thinks.  She does have a history of seasonal allergies, rhinitis.  For which she takes antihistamines and Flonase.  No other environmental or seasonal factors she can identify to make things better or worse.  Albuterol helps some albeit fleeting.  No position make things better or worse.  No other alleviating or exacerbating factors.  Reviewed most recent chest x-ray 3/04-2022 that on my interpretation is clear.  While she lives in [place] she is being seen here.  She states that was taking too long to find a doctor in [place].  The most available or shortest timeframe to initial appointment was here.  Discussed that having to continue to care for her but do suspect that she could be managed locally in [place].  She expressed understanding would like to be seen closer to home if able.  Referral to local pulmonary practice in [place], address was searched and Google maps and shown to her and she says is close to her house, was sent today.  PMH: Asthma, seasonal allergies Surgical history: Tubal ligation Family history: Mother with  diabetes, hypertension, arthritis Social history: Never smoker, grew up in [place], PCP in [place], however she lives in [place] / Pulmonary Flowsheets:   ACT:      View : No data to display.          MMRC:     View : No data to display.          [facility]:      View : No data to display.          Tests:   FENO:  No results found for: NITRICOXIDE  PFT:     View : No data to display.          WALK:      View : No data to display.          Imaging: No results found. Personally reviewed and as per EMR Lab Results: Personally reviewed CBC    Component Value Date/Time   WBC 7.3 12/06/2021 1600   RBC 4.07 12/06/2021 1600   HGB 13.5 12/06/2021 1600   HCT 41.1 12/06/2021 1600   PLT 359.0 12/06/2021 1600   MCV 101.0 (H) 12/06/2021 1600   MCH 32.3 01/03/2013 1530   MCHC 32.8 12/06/2021 1600   RDW 12.2 12/06/2021 1600   LYMPHSABS 2.5 12/06/2021 1600   MONOABS 0.4 12/06/2021 1600   EOSABS 0.4 12/06/2021 1600   BASOSABS 0.0 12/06/2021 1600    BMET No results found for: NA, K, CL, CO2, GLUCOSE, BUN, CREATININE,  CALCIUM, GFRNONAA, GFRAA  BNP No results found for: BNP  ProBNP No results found for: PROBNP  Specialty Problems   None   No Known Allergies   There is no immunization history on file for this patient.  Past Medical History:  Diagnosis Date   Medical history non-contributory    Seasonal allergies    SVD (spontaneous vaginal delivery)    x 1    Tobacco History: Social History   Tobacco Use  Smoking Status Never  Smokeless Tobacco Never   Counseling given: Not Answered   Continue to not smoke  Outpatient Encounter Medications as of 12/06/2021  Medication Sig   albuterol (PROAIR HFA) 108 (90 Base) MCG/ACT inhaler Inhale 2 puffs into the lungs every 6 (six) hours as needed for wheezing or shortness of breath.   ALPRAZolam (XANAX) 0.5 MG tablet Take 0.25-0.5 mg by mouth 2 (two) times daily as  needed.   Cyanocobalamin (B-12 PO) Take 2 tablets by mouth daily.   cyclobenzaprine (FLEXERIL) 10 MG tablet TAKE 1 TABLET BY MOUTH 3 TIMES A DAY AS NEEDED FOR SPASM   DULoxetine (CYMBALTA) 30 MG capsule Take 30 mg by mouth 2 (two) times daily.   ibuprofen (ADVIL,MOTRIN) 800 MG tablet Take 1 tablet (800 mg total) by mouth every 8 (eight) hours as needed.   [EXPIRED] predniSONE (DELTASONE) 20 MG tablet Take 1 tablet (20 mg total) by mouth daily with breakfast for 5 days.   [DISCONTINUED] albuterol (VENTOLIN HFA) 108 (90 Base) MCG/ACT inhaler Inhale 2 puffs into the lungs every 4 (four) hours as needed.   [DISCONTINUED] Budeson-Glycopyrrol-Formoterol (BREZTRI AEROSPHERE) 160-9-4.8 MCG/ACT AERO Inhale 2 puffs into the lungs in the morning and at bedtime.   buPROPion (WELLBUTRIN SR) 100 MG 12 hr tablet Take 100 mg by mouth 2 (two) times daily. (Patient not taking: Reported on 12/06/2021)   traZODone (DESYREL) 100 MG tablet TAKE 1/2 TO 1 TABLET BY MOUTH AT BEDTIME AS NEEDED FOR INSOMNIA (Patient not taking: Reported on 12/06/2021)   No facility-administered encounter medications on file as of 12/06/2021.     Review of Systems  Review of Systems  No chest pain with exertion.  No orthopnea or PND.  No lower extremity swelling.  Comprehensive review of systems otherwise negative. Physical Exam  BP 124/70 (BP Location: Left Arm, Patient Position: Sitting, Cuff Size: Normal)   Pulse 96   Temp 97.9 F (36.6 C) (Oral)   Ht 5\' 9"  (1.753 m)   Wt 257 lb 6.4 oz (116.8 kg)   SpO2 98%   BMI 38.01 kg/m   Wt Readings from Last 5 Encounters:  12/06/21 257 lb 6.4 oz (116.8 kg)  06/12/18 229 lb (103.9 kg)  02/10/17 237 lb 12.8 oz (107.9 kg)  07/28/14 227 lb 6.4 oz (103.1 kg)  01/03/13 215 lb (97.5 kg)    BMI Readings from Last 5 Encounters:  12/06/21 38.01 kg/m  06/12/18 33.33 kg/m  02/10/17 34.61 kg/m  07/28/14 33.58 kg/m  01/03/13 31.75 kg/m     Physical Exam General: Well-appearing, no  acute distress Eyes: EOMI, no icterus Neck: Supple, no JVP Pulmonary: Clear, no wheeze Cardiovascular: Warm, regular rate and rhythm Abdomen: Nondistended, bowel sounds present MSK: No synovitis, joint effusion Neuro: Normal gait, no weakness Psych: Normal gait, no weakness    Assessment & Plan:   Chronic cough: Chest x-ray 10/27/2021 clear.  Suspect asthma with recent trigger of worsening with viral illness.  Prednisone burst today given significant symptom burden.  Albuterol refilled.  Escalate  ICS/LABA (has used Advair and Symbicort in the past) to [place] given severity of cough relatively uncontrolled on ICS/LABA therapy alone.  Labs today for phenotyping.  Referral to pulmonary office closer to home to help manage.  She lives in [place].  Notably, was treated with PPI for possible GERD with cough subsequently worsening, not improving.  Asthma: See above  Seasonal allergies, postnasal drip: Continue Flonase, antihistamine.   Return in about 3 months (around 03/07/2022).   Karren BurlyMatthew R Tenley Winward, MD 01/17/2022

## 2021-12-06 NOTE — Patient Instructions (Addendum)
Nice to meet you ? ?Oh your cough was triggered by a virus, postviral cough similar to asthma.  Seems albuterol and the prednisone has helped in the past.  Your chest x-ray has looked clear recently. ? ?I sent a supply of prednisone for a few days to help with the cough ? ?I refilled the albuterol.  Use as needed for cough or shortness of breath.  In addition, to help with the cough, I recommend a new stronger inhaler.  Use Breztri 2 puffs twice a day every day.  Your mouth out with water after every use.  I provided a co-pay card to help with cost. ? ?Do not use Advair or Symbicort while using the Breztri. ? ?We will get labs today looking for other signs of inflammation that may be difficult to treat with inhalers. ? ?I sent a referral to a pulmonary office closer to her home.  I am happy to see you at any time and continue to help work through this.  However, I think would be valuable to have some a little closer to home.  I think this will be much more convenient.  But I am happy to see you at any time. ? ?Return to clinic in 3 months or sooner as needed with Dr. Judeth Horn, if you have established with another lung doctor in the meantime it is okay to cancel this appointment ?

## 2021-12-07 ENCOUNTER — Other Ambulatory Visit (HOSPITAL_COMMUNITY): Payer: Self-pay

## 2021-12-07 ENCOUNTER — Telehealth: Payer: Self-pay | Admitting: Pulmonary Disease

## 2021-12-07 LAB — CBC WITH DIFFERENTIAL/PLATELET
Basophils Absolute: 0 10*3/uL (ref 0.0–0.1)
Basophils Relative: 0.7 % (ref 0.0–3.0)
Eosinophils Absolute: 0.4 10*3/uL (ref 0.0–0.7)
Eosinophils Relative: 5.4 % — ABNORMAL HIGH (ref 0.0–5.0)
HCT: 41.1 % (ref 36.0–46.0)
Hemoglobin: 13.5 g/dL (ref 12.0–15.0)
Lymphocytes Relative: 34.5 % (ref 12.0–46.0)
Lymphs Abs: 2.5 10*3/uL (ref 0.7–4.0)
MCHC: 32.8 g/dL (ref 30.0–36.0)
MCV: 101 fl — ABNORMAL HIGH (ref 78.0–100.0)
Monocytes Absolute: 0.4 10*3/uL (ref 0.1–1.0)
Monocytes Relative: 5.8 % (ref 3.0–12.0)
Neutro Abs: 3.9 10*3/uL (ref 1.4–7.7)
Neutrophils Relative %: 53.6 % (ref 43.0–77.0)
Platelets: 359 10*3/uL (ref 150.0–400.0)
RBC: 4.07 Mil/uL (ref 3.87–5.11)
RDW: 12.2 % (ref 11.5–15.5)
WBC: 7.3 10*3/uL (ref 4.0–10.5)

## 2021-12-07 LAB — IGE: IgE (Immunoglobulin E), Serum: 268 kU/L — ABNORMAL HIGH (ref <=114)

## 2021-12-07 NOTE — Telephone Encounter (Signed)
Ok for letter

## 2021-12-07 NOTE — Telephone Encounter (Signed)
Called patient and she was seen with MH on Tuesday. She is wanting a work noted that states for her to out from Wednesday(12/07/21) to Monday(12/12/21) due to her having bronchitis. I advised her that I would have to message MH to get approval for this letter or not.  ? ?She also would like something more cost effective treatment then Breztri. I am also sending this message to the PA team just in case we need a PA for the [facility].  ? ?MH please advise!!  ?

## 2021-12-07 NOTE — Telephone Encounter (Signed)
Sandi Mariscal, Per MH can we start the PA process for [facility].  ? ?Thank you  ?

## 2021-12-08 ENCOUNTER — Telehealth: Payer: Self-pay

## 2021-12-08 ENCOUNTER — Telehealth: Payer: Self-pay | Admitting: Pulmonary Disease

## 2021-12-08 ENCOUNTER — Other Ambulatory Visit (HOSPITAL_COMMUNITY): Payer: Self-pay

## 2021-12-08 NOTE — Telephone Encounter (Signed)
Patient Advocate Encounter ?  ?Received notification from patient calls that prior authorization for [facility] inhaler is required by his/her insurance [employer]. ?  ?PA submitted on 12/08/21 ? ?Key#: [id number] ? ?Status is pending ?   ?[facility] will continue to follow: ? ?Patient Advocate ?Fax: [phone]  ?

## 2021-12-08 NOTE — Telephone Encounter (Signed)
Spoke pt and updated her on [place] also changed pt's pharmacy at pt request. Notified pt that April would have her work note ready by end of day and pt stated that she would have sister come pick it up from office tomorrow.  ? ?Routing to April so she can know to place letter up front for pt pickup. Nothing further needed at this time.  ?

## 2021-12-08 NOTE — Telephone Encounter (Signed)
Letter printed and placed up front. Nothing further needed  ?

## 2021-12-08 NOTE — Telephone Encounter (Signed)
PA for her Breztri inhaler has been started by prior [place] team and this is able to be seen in a separate encounter for status updates. ? ? ?Attempted to call pt but line went directly to VM. Left message for her to return call. ?

## 2021-12-08 NOTE — Telephone Encounter (Signed)
Called and left voicemail for patient that her prior auth for the Kimberly Clayton was pending.And that I would be printing off her work note by the end of the day for her. Asked her to call office back.  ?

## 2021-12-08 NOTE — Telephone Encounter (Signed)
Patient Advocate Encounter ? ?Received notification from [facility] [employer] that the request for prior authorization for Breztri inhaler has been denied due to the patient not trying 2 of the preferred inhalers: Advair Diskus, Symbicort, Spiriva Handihaler/Respimat, Bevespi or Yupelri. ?  ? ?Specialty Pharmacy Patient Advocate ?Fax: [phone]  ?

## 2021-12-09 LAB — ALLERGEN PROFILE, PERENNIAL ALLERGEN IGE

## 2021-12-09 NOTE — Telephone Encounter (Signed)
Patient Advocate Encounter ?  ?Received notification from [facility] [employer] that the request for prior authorization for Breztri inhaler has been denied due to the patient not trying 2 of the preferred inhalers: Advair Diskus, Symbicort, Spiriva Handihaler/Respimat, Bevespi or Yupelri. ?  ?  ?Please advise Dr Judeth Horn  ? ?Which inhaler would you like to try for this patient? ?

## 2021-12-09 NOTE — Telephone Encounter (Signed)
Another encounter is open regarding this matter for this patient. Will close this encounter to make sure not so many are open for one issue.  ? ? ?

## 2021-12-10 NOTE — Telephone Encounter (Signed)
She has failed Advair diskus and Symbicort, will make sure note reflects this.

## 2021-12-12 NOTE — Telephone Encounter (Signed)
Kimberly Clayton, ? ?Dr Judeth Horn states that she failed Advair diskus and Symbicort. HE states that he will make sure his notes reflect this.  ? ?Do you think we can resubmit the PA for her.  ? ?Thank you  ?

## 2021-12-15 NOTE — Telephone Encounter (Signed)
Per our Prior Auth team patient needs to try one more formulary alternative to be approved from the [employer]. The formulary alternatives are: ? ?Adviar Diskus (Tried and failed) ?Symbicort (Tried and failed) ?Spriva HandiHaler/Respimat ?Bevespri ?Maretta Bees ? ?Please advise Dr Silas Flood  ?

## 2021-12-16 MED ORDER — SPIRIVA RESPIMAT 2.5 MCG/ACT IN AERS: 2.0000 | INHALATION_SPRAY | Freq: Every day | RESPIRATORY_TRACT | 1 refills | Status: DC

## 2021-12-16 NOTE — Telephone Encounter (Signed)
Add Spiriva and instruct her to continue Symbicort or advair (whatever she has on hand) as insurance is making [place] do this instead of approving medicine I recommend. If needs refill on either ok for approval.

## 2021-12-16 NOTE — Addendum Note (Signed)
Addended byVilma Meckel on: 12/16/2021 09:33 AM ? ? Modules accepted: Orders ? ?

## 2021-12-16 NOTE — Telephone Encounter (Signed)
Called and left voicemail for patient to call office back in regards to adding an inhaler.  ?

## 2021-12-21 ENCOUNTER — Telehealth: Payer: Self-pay | Admitting: Pulmonary Disease

## 2021-12-21 NOTE — Telephone Encounter (Signed)
Called patient but she did not answer. Left message for her to call back.  

## 2021-12-21 NOTE — Telephone Encounter (Signed)
Attempted to call patient again but her mailbox was full. Will send letter. Closing encounter per office protocol.  ?

## 2022-01-05 MED ORDER — BUDESONIDE-FORMOTEROL FUMARATE 160-4.5 MCG/ACT IN AERO: 2.0000 | INHALATION_SPRAY | Freq: Two times a day (BID) | RESPIRATORY_TRACT | 6 refills | Status: AC

## 2022-01-05 MED ORDER — SPIRIVA RESPIMAT 2.5 MCG/ACT IN AERS: 2.0000 | INHALATION_SPRAY | Freq: Every day | RESPIRATORY_TRACT | 6 refills | Status: AC

## 2022-01-05 NOTE — Telephone Encounter (Signed)
Called and spoke with pt about medications and stated to her recs per MH. Pt verbalized understanding and have sent Rx for Symbicort as well as Spiriva to preferred pharmacy for pt. Nothing further needed.
